# Patient Record
Sex: Female | Born: 2000 | Race: White | Hispanic: No | Smoking: Former smoker
Health system: Southern US, Community
[De-identification: ages and names within clinical notes are randomized; demographics above are authoritative.]

## PROBLEM LIST (undated history)

## (undated) DIAGNOSIS — N189 Chronic kidney disease, unspecified: Secondary | ICD-10-CM

## (undated) DIAGNOSIS — K759 Inflammatory liver disease, unspecified: Secondary | ICD-10-CM

## (undated) DIAGNOSIS — J40 Bronchitis, not specified as acute or chronic: Secondary | ICD-10-CM

## (undated) DIAGNOSIS — F909 Attention-deficit hyperactivity disorder, unspecified type: Principal | ICD-10-CM

## (undated) DIAGNOSIS — J309 Allergic rhinitis, unspecified: Secondary | ICD-10-CM

## (undated) DIAGNOSIS — T7840XA Allergy, unspecified, initial encounter: Secondary | ICD-10-CM

## (undated) DIAGNOSIS — E639 Nutritional deficiency, unspecified: Secondary | ICD-10-CM

## (undated) DIAGNOSIS — R51 Headache: Secondary | ICD-10-CM

## (undated) DIAGNOSIS — K5900 Constipation, unspecified: Secondary | ICD-10-CM

## (undated) DIAGNOSIS — R519 Headache, unspecified: Secondary | ICD-10-CM

## (undated) DIAGNOSIS — M419 Scoliosis, unspecified: Secondary | ICD-10-CM

## (undated) DIAGNOSIS — R011 Cardiac murmur, unspecified: Secondary | ICD-10-CM

## (undated) DIAGNOSIS — L659 Nonscarring hair loss, unspecified: Secondary | ICD-10-CM

## (undated) DIAGNOSIS — F419 Anxiety disorder, unspecified: Secondary | ICD-10-CM

## (undated) DIAGNOSIS — J4520 Mild intermittent asthma, uncomplicated: Secondary | ICD-10-CM

## (undated) DIAGNOSIS — R5383 Other fatigue: Secondary | ICD-10-CM

## (undated) DIAGNOSIS — S83249A Other tear of medial meniscus, current injury, unspecified knee, initial encounter: Secondary | ICD-10-CM

## (undated) DIAGNOSIS — J189 Pneumonia, unspecified organism: Secondary | ICD-10-CM

## (undated) HISTORY — DX: Headache, unspecified: R51.9

## (undated) HISTORY — DX: Inflammatory liver disease, unspecified: K75.9

## (undated) HISTORY — DX: Other tear of medial meniscus, current injury, unspecified knee, initial encounter: S83.249A

## (undated) HISTORY — DX: Cardiac murmur, unspecified: R01.1

## (undated) HISTORY — DX: Other fatigue: R53.83

## (undated) HISTORY — DX: Nutritional deficiency, unspecified: E63.9

## (undated) HISTORY — DX: Constipation, unspecified: K59.00

## (undated) HISTORY — DX: Allergic rhinitis, unspecified: J30.9

## (undated) HISTORY — DX: Anxiety disorder, unspecified: F41.9

## (undated) HISTORY — PX: NO PAST SURGERIES: SHX2092

## (undated) HISTORY — DX: Allergy, unspecified, initial encounter: T78.40XA

## (undated) HISTORY — DX: Headache: R51

## (undated) HISTORY — DX: Attention-deficit hyperactivity disorder, unspecified type: F90.9

## (undated) HISTORY — DX: Bronchitis, not specified as acute or chronic: J40

## (undated) HISTORY — DX: Mild intermittent asthma, uncomplicated: J45.20

## (undated) HISTORY — DX: Nonscarring hair loss, unspecified: L65.9

## (undated) HISTORY — DX: Chronic kidney disease, unspecified: N18.9

---
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
## 2022-01-08 ENCOUNTER — Telehealth: Payer: Self-pay | Admitting: Genetics

## 2022-01-10 ENCOUNTER — Inpatient Hospital Stay (HOSPITAL_COMMUNITY)
Admission: EM | Admit: 2022-01-10 | Discharge: 2022-01-10 | Disposition: A | Discharge status: Home / Self Care | Payer: Medicaid Other | Attending: Obstetrics and Gynecology | Admitting: Obstetrics and Gynecology

## 2022-01-10 ENCOUNTER — Other Ambulatory Visit: Payer: Self-pay

## 2022-01-10 ENCOUNTER — Emergency Department (HOSPITAL_COMMUNITY): Payer: Medicaid Other

## 2022-01-10 ENCOUNTER — Ambulatory Visit (INDEPENDENT_AMBULATORY_CARE_PROVIDER_SITE_OTHER): Payer: Medicaid Other | Admitting: Family Medicine

## 2022-01-10 VITALS — BP 101/67 | HR 131 | Wt 104.0 lb

## 2022-01-10 DIAGNOSIS — Z3A18 18 weeks gestation of pregnancy: Secondary | ICD-10-CM | POA: Diagnosis not present

## 2022-01-10 DIAGNOSIS — M25561 Pain in right knee: Secondary | ICD-10-CM | POA: Insufficient documentation

## 2022-01-10 DIAGNOSIS — I959 Hypotension, unspecified: Secondary | ICD-10-CM | POA: Diagnosis not present

## 2022-01-10 DIAGNOSIS — W2212XA Striking against or struck by front passenger side automobile airbag, initial encounter: Secondary | ICD-10-CM | POA: Diagnosis not present

## 2022-01-10 DIAGNOSIS — Z3A17 17 weeks gestation of pregnancy: Secondary | ICD-10-CM

## 2022-01-10 DIAGNOSIS — O9A212 Injury, poisoning and certain other consequences of external causes complicating pregnancy, second trimester: Secondary | ICD-10-CM | POA: Insufficient documentation

## 2022-01-10 DIAGNOSIS — Z3480 Encounter for supervision of other normal pregnancy, unspecified trimester: Secondary | ICD-10-CM

## 2022-01-10 DIAGNOSIS — Y93I9 Activity, other involving external motion: Secondary | ICD-10-CM | POA: Diagnosis not present

## 2022-01-10 DIAGNOSIS — R000 Tachycardia, unspecified: Secondary | ICD-10-CM | POA: Diagnosis not present

## 2022-01-10 DIAGNOSIS — Z68.41 Body mass index (BMI) pediatric, less than 5th percentile for age: Secondary | ICD-10-CM

## 2022-01-10 DIAGNOSIS — Z041 Encounter for examination and observation following transport accident: Secondary | ICD-10-CM | POA: Insufficient documentation

## 2022-01-10 DIAGNOSIS — S80919A Unspecified superficial injury of unspecified knee, initial encounter: Secondary | ICD-10-CM | POA: Diagnosis not present

## 2022-01-10 DIAGNOSIS — Q970 Karyotype 47, XXX: Secondary | ICD-10-CM

## 2022-01-10 DIAGNOSIS — M419 Scoliosis, unspecified: Secondary | ICD-10-CM

## 2022-01-10 DIAGNOSIS — S8011XA Contusion of right lower leg, initial encounter: Secondary | ICD-10-CM | POA: Diagnosis not present

## 2022-01-10 DIAGNOSIS — M7989 Other specified soft tissue disorders: Secondary | ICD-10-CM | POA: Diagnosis not present

## 2022-01-10 DIAGNOSIS — Z3492 Encounter for supervision of normal pregnancy, unspecified, second trimester: Secondary | ICD-10-CM | POA: Diagnosis not present

## 2022-01-10 DIAGNOSIS — R609 Edema, unspecified: Secondary | ICD-10-CM | POA: Diagnosis not present

## 2022-01-10 NOTE — Progress Notes (Signed)
WRitten and verbal d/c instructions given and understanding voiced.  
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
## 2022-01-10 NOTE — Progress Notes (Signed)
   PRENATAL VISIT NOTE  Subjective:  Melissa Farley is a 21 y.o. G2P0010 at [redacted]w[redacted]d being seen today for ongoing prenatal care.  She is currently monitored for the following issues for this high-risk pregnancy and has ADHD (attention deficit hyperactivity disorder); Allergic rhinitis; Fatigue; Generalized headaches; Inattention; Child in care of non-parental family member; PTSD (post-traumatic stress disorder); Pain in thoracic spine; Scoliosis of thoracic spine; Stiffness of joint, pelvic region and thigh; Muscle weakness (generalized); Murmur; Vasovagal syncope; Irregular menses; Hair loss; BMI (body mass index), pediatric, less than 5th percentile for age; Dermatitis due to cat hair; Generalized abdominal pain; Gastroesophageal reflux disease without esophagitis; Supervision of other normal pregnancy, antepartum; Abnormal genetic test during pregnancy; and Trisomy X syndrome on their problem list.  Patient reports no complaints.  Contractions: Not present. Vag. Bleeding: None.  Movement: Absent. Denies leaking of fluid.   The following portions of the patient's history were reviewed and updated as appropriate: allergies, current medications, past family history, past medical history, past social history, past surgical history and problem list.   Objective:   Vitals:   01/10/22 1339  BP: 101/67  Pulse: (!) 131  Weight: 104 lb (47.2 kg)    Fetal Status: Fetal Heart Rate (bpm): 153   Movement: Absent     General:  Alert, oriented and cooperative. Patient is in no acute distress.  Skin: Skin is warm and dry. No rash noted.   Cardiovascular: Normal heart rate noted  Respiratory: Normal respiratory effort, no problems with respiration noted  Abdomen: Soft, gravid, appropriate for gestational age.  Pain/Pressure: Absent     Pelvic: Cervical exam deferred        Extremities: Normal range of motion.  Edema: None  Mental Status: Normal mood and affect. Normal behavior. Normal judgment and  thought content.   Assessment and Plan:  Pregnancy: G2P0010 at [redacted]w[redacted]d 1. [redacted] weeks gestation of pregnancy - AFP, Serum, Open Spina Bifida  2. Supervision of other normal pregnancy, antepartum FHT and FH normal Has [place] next week.  3. Trisomy X syndrome Maternal Trisomy X Patient declines amnio Has referral to [facility]  4. Scoliosis of thoracic spine, unspecified scoliosis type   Preterm labor symptoms and general obstetric precautions including but not limited to vaginal bleeding, contractions, leaking of fluid and fetal movement were reviewed in detail with the patient. Please refer to After Visit Summary for other counseling recommendations.   No follow-ups on file.  Future Appointments  Date Time Provider Department Center  01/18/2022 12:45 PM [facility] NURSE [facility]  01/18/2022  1:00 PM [facility] US1 [facility]  02/07/2022  1:10 PM Reva Bores, MD [facility] None    Levie Heritage, DO
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
## 2022-01-10 NOTE — ED Notes (Signed)
Report given to MAU nurse 

## 2022-01-10 NOTE — Progress Notes (Signed)
Orthopedic Tech Progress Note Patient Details:  Melissa Farley Feb 16, 2001 [id number]  Ortho Devices Type of Ortho Device: Knee Immobilizer Ortho Device/Splint Location: rle Ortho Device/Splint Interventions: Ordered, Application, Adjustment   Post Interventions Patient Tolerated: Well Instructions Provided: Care of device, Adjustment of device  [name] Post 01/10/2022, 7:30 PM

## 2022-01-10 NOTE — ED Notes (Addendum)
This RN called ortho regarding the application of the knee immobilizer. Per ortho, they will be a couple of minutes as they are in another room at the moment.

## 2022-01-10 NOTE — MAU Note (Signed)
Marie Williams CNM in Family Rm to see pt and discuss plan of care 
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
## 2022-01-12 LAB — AFP, SERUM, OPEN SPINA BIFIDA
AFP MoM: 1.42
AFP Value: 77.8 ng/mL
Gest. Age on Collection Date: 17.6 weeks
Maternal Age At EDD: 19.3 yr
OSBR Risk 1 IN: 3404
Test Results:: NEGATIVE
Weight: 104 [lb_av]

## 2022-01-17 DIAGNOSIS — S8001XA Contusion of right knee, initial encounter: Secondary | ICD-10-CM | POA: Diagnosis not present

## 2022-01-18 ENCOUNTER — Ambulatory Visit: Payer: Medicaid Other | Admitting: *Deleted

## 2022-01-18 ENCOUNTER — Ambulatory Visit: Payer: Medicaid Other | Attending: Family Medicine

## 2022-01-18 ENCOUNTER — Other Ambulatory Visit: Payer: Self-pay | Admitting: *Deleted

## 2022-01-18 VITALS — BP 109/57 | HR 105

## 2022-01-18 DIAGNOSIS — O359XX0 Maternal care for (suspected) fetal abnormality and damage, unspecified, not applicable or unspecified: Secondary | ICD-10-CM | POA: Diagnosis not present

## 2022-01-18 DIAGNOSIS — O289 Unspecified abnormal findings on antenatal screening of mother: Secondary | ICD-10-CM | POA: Diagnosis not present

## 2022-01-18 DIAGNOSIS — Z148 Genetic carrier of other disease: Secondary | ICD-10-CM | POA: Insufficient documentation

## 2022-01-18 DIAGNOSIS — Z3480 Encounter for supervision of other normal pregnancy, unspecified trimester: Secondary | ICD-10-CM

## 2022-01-18 DIAGNOSIS — R52 Pain, unspecified: Secondary | ICD-10-CM | POA: Diagnosis not present

## 2022-01-18 DIAGNOSIS — Z68.41 Body mass index (BMI) pediatric, less than 5th percentile for age: Secondary | ICD-10-CM

## 2022-01-18 DIAGNOSIS — Z3A19 19 weeks gestation of pregnancy: Secondary | ICD-10-CM | POA: Insufficient documentation

## 2022-01-18 DIAGNOSIS — S83251A Bucket-handle tear of lateral meniscus, current injury, right knee, initial encounter: Secondary | ICD-10-CM | POA: Diagnosis not present

## 2022-01-18 DIAGNOSIS — O285 Abnormal chromosomal and genetic finding on antenatal screening of mother: Secondary | ICD-10-CM | POA: Insufficient documentation

## 2022-01-18 DIAGNOSIS — Q970 Karyotype 47, XXX: Secondary | ICD-10-CM

## 2022-01-18 DIAGNOSIS — O09892 Supervision of other high risk pregnancies, second trimester: Secondary | ICD-10-CM

## 2022-01-18 DIAGNOSIS — O358XX0 Maternal care for other (suspected) fetal abnormality and damage, not applicable or unspecified: Secondary | ICD-10-CM | POA: Diagnosis not present

## 2022-01-21 ENCOUNTER — Encounter: Payer: Self-pay | Admitting: Family Medicine

## 2022-01-21 DIAGNOSIS — O3503X0 Maternal care for (suspected) central nervous system malformation or damage in fetus, choroid plexus cysts, not applicable or unspecified: Secondary | ICD-10-CM | POA: Insufficient documentation

## 2022-01-29 ENCOUNTER — Other Ambulatory Visit: Payer: Self-pay

## 2022-01-29 ENCOUNTER — Inpatient Hospital Stay (HOSPITAL_BASED_OUTPATIENT_CLINIC_OR_DEPARTMENT_OTHER)
Admission: AD | Admit: 2022-01-29 | Discharge: 2022-01-31 | DRG: 833 | Disposition: A | Discharge status: Home / Self Care | Payer: Medicaid Other | Attending: Obstetrics and Gynecology | Admitting: Obstetrics and Gynecology

## 2022-01-29 ENCOUNTER — Encounter (HOSPITAL_BASED_OUTPATIENT_CLINIC_OR_DEPARTMENT_OTHER): Payer: Self-pay | Admitting: Emergency Medicine

## 2022-01-29 DIAGNOSIS — R109 Unspecified abdominal pain: Principal | ICD-10-CM

## 2022-01-29 DIAGNOSIS — N10 Acute tubulo-interstitial nephritis: Secondary | ICD-10-CM | POA: Diagnosis not present

## 2022-01-29 DIAGNOSIS — N390 Urinary tract infection, site not specified: Secondary | ICD-10-CM | POA: Diagnosis present

## 2022-01-29 DIAGNOSIS — O2302 Infections of kidney in pregnancy, second trimester: Principal | ICD-10-CM | POA: Diagnosis present

## 2022-01-29 DIAGNOSIS — Z87891 Personal history of nicotine dependence: Secondary | ICD-10-CM

## 2022-01-29 DIAGNOSIS — Z3A20 20 weeks gestation of pregnancy: Secondary | ICD-10-CM | POA: Diagnosis not present

## 2022-01-29 DIAGNOSIS — R1110 Vomiting, unspecified: Secondary | ICD-10-CM | POA: Diagnosis present

## 2022-01-29 DIAGNOSIS — O26892 Other specified pregnancy related conditions, second trimester: Secondary | ICD-10-CM | POA: Diagnosis not present

## 2022-01-29 HISTORY — DX: Urinary tract infection, site not specified: N39.0

## 2022-01-29 LAB — COMPREHENSIVE METABOLIC PANEL
ALT: 10 U/L (ref 0–44)
AST: 19 U/L (ref 15–41)
Albumin: 3.4 g/dL — ABNORMAL LOW (ref 3.5–5.0)
Alkaline Phosphatase: 45 U/L (ref 38–126)
Anion gap: 7 (ref 5–15)
BUN: 8 mg/dL (ref 6–20)
CO2: 19 mmol/L — ABNORMAL LOW (ref 22–32)
Calcium: 8.8 mg/dL — ABNORMAL LOW (ref 8.9–10.3)
Chloride: 109 mmol/L (ref 98–111)
Creatinine, Ser: 0.55 mg/dL (ref 0.44–1.00)
GFR, Estimated: >60 mL/min (ref >60)
Glucose, Bld: 131 mg/dL — ABNORMAL HIGH (ref 70–99)
Potassium: 3.7 mmol/L (ref 3.5–5.1)
Sodium: 135 mmol/L (ref 135–145)
Total Bilirubin: 0.5 mg/dL (ref 0.3–1.2)
Total Protein: 7 g/dL (ref 6.5–8.1)

## 2022-01-29 LAB — URINALYSIS, ROUTINE W REFLEX MICROSCOPIC
Bilirubin Urine: NEGATIVE
Glucose, UA: NEGATIVE mg/dL
Ketones, ur: 15 mg/dL — AB
Leukocytes,Ua: NEGATIVE
Nitrite: NEGATIVE
Protein, ur: 100 mg/dL — AB
Specific Gravity, Urine: >=1.03 (ref 1.005–1.030)
pH: 5.5 (ref 5.0–8.0)

## 2022-01-29 LAB — TYPE AND SCREEN
ABO/RH(D): A POS
Antibody Screen: NEGATIVE

## 2022-01-29 LAB — CBC WITH DIFFERENTIAL/PLATELET
Abs Immature Granulocytes: 0.05 10*3/uL (ref 0.00–0.07)
Basophils Absolute: 0 10*3/uL (ref 0.0–0.1)
Basophils Relative: 0 %
Eosinophils Absolute: 0 10*3/uL (ref 0.0–0.5)
Eosinophils Relative: 0 %
HCT: 32.1 % — ABNORMAL LOW (ref 36.0–46.0)
Hemoglobin: 11.2 g/dL — ABNORMAL LOW (ref 12.0–15.0)
Immature Granulocytes: 1 %
Lymphocytes Relative: 12 %
Lymphs Abs: 1.2 10*3/uL (ref 0.7–4.0)
MCH: 32.9 pg (ref 26.0–34.0)
MCHC: 34.9 g/dL (ref 30.0–36.0)
MCV: 94.4 fL (ref 80.0–100.0)
Monocytes Absolute: 0.4 10*3/uL (ref 0.1–1.0)
Monocytes Relative: 4 %
Neutro Abs: 8.9 10*3/uL — ABNORMAL HIGH (ref 1.7–7.7)
Neutrophils Relative %: 83 %
Platelets: 149 10*3/uL — ABNORMAL LOW (ref 150–400)
RBC: 3.4 MIL/uL — ABNORMAL LOW (ref 3.87–5.11)
RDW: 13.7 % (ref 11.5–15.5)
WBC: 10.6 10*3/uL — ABNORMAL HIGH (ref 4.0–10.5)
nRBC: 0 % (ref 0.0–0.2)

## 2022-01-29 LAB — URINALYSIS, MICROSCOPIC (REFLEX): RBC / HPF: >50 RBC/hpf (ref 0–5)

## 2022-01-29 LAB — LIPASE, BLOOD: Lipase: 25 U/L (ref 11–51)

## 2022-01-29 MED ORDER — DOCUSATE SODIUM 100 MG PO CAPS
100.0000 mg | ORAL_CAPSULE | Freq: Every day | ORAL | Status: DC
  Administered 2022-01-30 – 2022-01-31 (×2): 100 mg via ORAL
  Filled 2022-01-29 (×2): qty 1

## 2022-01-29 MED ORDER — SODIUM CHLORIDE 0.9% FLUSH: 3.0000 mL | INTRAVENOUS | Status: DC | PRN

## 2022-01-29 MED ORDER — SODIUM CHLORIDE 0.9 % IV SOLN
2.0000 g | INTRAVENOUS | Status: DC
  Administered 2022-01-30 – 2022-01-31 (×2): 2 g via INTRAVENOUS
  Filled 2022-01-29 (×2): qty 20

## 2022-01-29 MED ORDER — ZOLPIDEM TARTRATE 5 MG PO TABS: 5.0000 mg | ORAL_TABLET | Freq: Every evening | ORAL | Status: DC | PRN

## 2022-01-29 MED ORDER — IBUPROFEN 600 MG PO TABS
600.0000 mg | ORAL_TABLET | Freq: Four times a day (QID) | ORAL | Status: DC | PRN
  Filled 2022-01-29: qty 1

## 2022-01-29 MED ORDER — SODIUM CHLORIDE 0.9 % IV SOLN
2.0000 g | Freq: Once | INTRAVENOUS | Status: AC
  Administered 2022-01-29: 2 g via INTRAVENOUS
  Filled 2022-01-29: qty 20

## 2022-01-29 MED ORDER — SODIUM CHLORIDE 0.9 % IV SOLN: INTRAVENOUS | Status: DC | PRN

## 2022-01-29 MED ORDER — LACTATED RINGERS IV BOLUS
1000.0000 mL | Freq: Once | INTRAVENOUS | Status: AC
  Administered 2022-01-29: 1000 mL via INTRAVENOUS

## 2022-01-29 MED ORDER — SODIUM CHLORIDE 0.9% FLUSH
3.0000 mL | Freq: Two times a day (BID) | INTRAVENOUS | Status: DC
  Administered 2022-01-29 – 2022-01-31 (×3): 3 mL via INTRAVENOUS

## 2022-01-29 MED ORDER — SODIUM CHLORIDE 0.9 % IV BOLUS
500.0000 mL | Freq: Once | INTRAVENOUS | Status: AC
  Administered 2022-01-29: 500 mL via INTRAVENOUS

## 2022-01-29 MED ORDER — PROMETHAZINE HCL 25 MG/ML IJ SOLN
INTRAMUSCULAR | Status: AC
  Filled 2022-01-29: qty 1

## 2022-01-29 MED ORDER — FENTANYL CITRATE PF 50 MCG/ML IJ SOSY
50.0000 ug | PREFILLED_SYRINGE | Freq: Once | INTRAMUSCULAR | Status: AC
  Administered 2022-01-29: 50 ug via INTRAVENOUS
  Filled 2022-01-29: qty 1

## 2022-01-29 MED ORDER — ACETAMINOPHEN 160 MG/5ML PO SOLN
650.0000 mg | ORAL | Status: DC | PRN
  Administered 2022-01-29: 650 mg via ORAL
  Filled 2022-01-29: qty 20.3

## 2022-01-29 MED ORDER — HYDROMORPHONE HCL 1 MG/ML IJ SOLN
0.5000 mg | Freq: Once | INTRAMUSCULAR | Status: AC
  Administered 2022-01-29: 0.5 mg via INTRAVENOUS
  Filled 2022-01-29: qty 1

## 2022-01-29 MED ORDER — HYDROMORPHONE HCL 1 MG/ML IJ SOLN
1.0000 mg | Freq: Once | INTRAMUSCULAR | Status: AC
  Administered 2022-01-30: 1 mg via INTRAVENOUS
  Filled 2022-01-29: qty 1

## 2022-01-29 MED ORDER — ACETAMINOPHEN 325 MG PO TABS
650.0000 mg | ORAL_TABLET | ORAL | Status: DC | PRN
  Filled 2022-01-29: qty 2

## 2022-01-29 MED ORDER — CALCIUM CARBONATE ANTACID 500 MG PO CHEW: 2.0000 | CHEWABLE_TABLET | ORAL | Status: DC | PRN

## 2022-01-29 MED ORDER — ONDANSETRON HCL 4 MG/2ML IJ SOLN
4.0000 mg | Freq: Once | INTRAMUSCULAR | Status: AC
  Administered 2022-01-29: 4 mg via INTRAVENOUS
  Filled 2022-01-29: qty 2

## 2022-01-29 MED ORDER — PRENATAL MULTIVITAMIN CH
1.0000 | ORAL_TABLET | Freq: Every day | ORAL | Status: DC
  Administered 2022-01-30: 1 via ORAL
  Filled 2022-01-29 (×2): qty 1

## 2022-01-29 MED ORDER — CEFTRIAXONE SODIUM 1 G IJ SOLR
1.0000 g | Freq: Once | INTRAMUSCULAR | Status: DC
Start: 2022-01-29 — End: 2022-01-29

## 2022-01-29 MED ORDER — IBUPROFEN 100 MG/5ML PO SUSP
600.0000 mg | Freq: Four times a day (QID) | ORAL | Status: DC | PRN
  Administered 2022-01-29 – 2022-01-30 (×2): 600 mg via ORAL
  Filled 2022-01-29 (×2): qty 30

## 2022-01-29 MED ORDER — SODIUM CHLORIDE 0.9 % IV SOLN
250.0000 mL | INTRAVENOUS | Status: DC | PRN
  Administered 2022-01-31: 250 mL via INTRAVENOUS

## 2022-01-29 MED ORDER — SODIUM CHLORIDE 0.9 % IV SOLN
25.0000 mg | Freq: Four times a day (QID) | INTRAVENOUS | Status: DC | PRN
  Administered 2022-01-29: 25 mg via INTRAVENOUS
  Filled 2022-01-29: qty 1

## 2022-01-29 NOTE — ED Notes (Signed)
Apple juice and peanut butter crackers

## 2022-01-29 NOTE — ED Triage Notes (Addendum)
Pt states she is [redacted]wks pregnant. Sudden onset of severe pain in R flank/abd pain that started around 2330 last night. Endorses n/v and "feeling cold". Pt states no fetal movement that she has noticed since going to bed last night, FHT obtained in triage 144bpm. Denies bleeding from vagina or leaking of fluid. States pain is constant and only in flank and R side of abd. Urine sample obtained appears bloody.

## 2022-01-29 NOTE — H&P (Signed)
Melissa Farley is a 21 y.o. female presenting for pyelonephritis. OB History     Gravida  2   Para  0   Term  0   Preterm  0   AB  1   Living  0      SAB  1   IAB  0   Ectopic  0   Multiple  0   Live Births  0          Past Medical History:  Diagnosis Date   Acute medial meniscus tear    ADHD (attention deficit hyperactivity disorder) 11/09/2012   Allergic rhinitis 11/09/2012   Allergy    Alopecia    Anxiety    Mom with severe anxiety, became addicted to pain meds   Asthma, mild intermittent 03/10/2016   Cardiac murmur 03/25/2013   Constipation    Fatigue    Frequent headaches    Poor eating habits    Scoliosis of thoracic spine 04/19/2014   UTI (urinary tract infection) 01/29/2022   History reviewed. No pertinent surgical history. Family History: family history includes Behavior problems in her sister; Drug abuse in her father and mother; HIV in her paternal uncle; Hypertension in her maternal grandfather. Social History:  reports that she has quit smoking. Her smoking use included e-cigarettes. She has never used smokeless tobacco. She reports that she does not drink alcohol and does not use drugs.      Review of Systems  Constitutional:  Positive for chills.  Genitourinary:  Positive for flank pain.   History   Blood pressure (!) 94/53, pulse 70, temperature 98.2 F (36.8 C), temperature source Oral, resp. rate 16, height 5\' 9"  (1.753 m), weight 49.9 kg, last menstrual period 09/07/2021, SpO2 100 %. Exam Physical Exam Constitutional:      Appearance: Normal appearance.  Cardiovascular:     Rate and Rhythm: Normal rate and regular rhythm.  Pulmonary:     Effort: Pulmonary effort is normal.     Breath sounds: Normal breath sounds.  Abdominal:     General: Bowel sounds are normal.     Palpations: Abdomen is soft.     Comments: FH 19 cm  Genitourinary:    Comments: Deferred Musculoskeletal:     Comments: Right CVA tenderness   Neurological:     Mental Status: She is alert.     Prenatal labs: ABO, Rh: A/Positive/-- (05/04 1105) Antibody: Negative (05/04 1105) Rubella: 7.51 (05/04 1105) RPR: Non Reactive (05/04 1105)  HBsAg: Negative (05/04 1105)  HIV: Non Reactive (05/04 1105)  GBS:     Assessment/Plan: IUP 20 4/7 weeks Pyelonephritis  Admitted for IV antibiotics. UC pending. POC reviewed with pt. Suspect 48-72 hrs LOS   6/7 01/29/2022, 3:11 PM

## 2022-01-29 NOTE — ED Provider Notes (Signed)
[facility] EMERGENCY DEPARTMENT Provider Note   CSN: [id number] Arrival date & time: 01/29/22  0327     History {Add pertinent medical, surgical, social history, OB history to HPI:1} Chief Complaint  Patient presents with  . Flank Pain    Melissa Farley is a 21 y.o. female.  21 yo F G2P0 at approximately 20 weeks here with right sided flank pain since 11p. A couple episodes of emesis. No fevers. Uncomplicated pregnancy so far.    Flank Pain      Home Medications Prior to Admission medications   Medication Sig Start Date End Date Taking? Authorizing Provider  albuterol (PROAIR HFA) 108 (90 Base) MCG/ACT inhaler INHALE 2 PUFFS EVERY 4 HOURS AS NEEDED FOR WHEEZING OR SHORTNESS OF BREATH. Take to School Patient not taking: Reported on 11/15/2021 11/16/20   Rosiland Oz, MD  Prenatal MV & Min w/FA-DHA (PRENATAL GUMMIES PO) Take by mouth.    [provider]      Allergies    Penicillins    Review of Systems   Review of Systems  Genitourinary:  Positive for flank pain.    Physical Exam Updated Vital Signs BP (!) 147/73   Pulse 86   Temp 98.2 F (36.8 C) (Oral)   Resp 20   Ht 5\' 9"  (1.753 m)   Wt 49.9 kg   LMP 09/07/2021   SpO2 98%   BMI 16.24 kg/m  Physical Exam  ED Results / Procedures / Treatments   Labs (all labs ordered are listed, but only abnormal results are displayed) Labs Reviewed  CBC WITH DIFFERENTIAL/PLATELET - Abnormal; Notable for the following components:      Result Value   WBC 10.6 (*)    RBC 3.40 (*)    Hemoglobin 11.2 (*)    HCT 32.1 (*)    Platelets 149 (*)    Neutro Abs 8.9 (*)    All other components within normal limits  COMPREHENSIVE METABOLIC PANEL - Abnormal; Notable for the following components:   CO2 19 (*)    Glucose, Bld 131 (*)    Calcium 8.8 (*)    Albumin 3.4 (*)    All other components within normal limits  URINALYSIS, ROUTINE W REFLEX MICROSCOPIC - Abnormal; Notable for the following  components:   APPearance CLOUDY (*)    Hgb urine dipstick LARGE (*)    Ketones, ur 15 (*)    Protein, ur 100 (*)    All other components within normal limits  URINALYSIS, MICROSCOPIC (REFLEX) - Abnormal; Notable for the following components:   Bacteria, UA MANY (*)    All other components within normal limits  URINE CULTURE  LIPASE, BLOOD    EKG None  Radiology No results found.  Procedures Procedures  {Document cardiac monitor, telemetry assessment procedure when appropriate:1}  Medications Ordered in ED Medications  cefTRIAXone (ROCEPHIN) 2 g in sodium chloride 0.9 % 100 mL IVPB (has no administration in time range)  lactated ringers bolus 1,000 mL (1,000 mLs Intravenous New Bag/Given 01/29/22 0429)  ondansetron (ZOFRAN) injection 4 mg (4 mg Intravenous Given 01/29/22 0418)  fentaNYL (SUBLIMAZE) injection 50 mcg (50 mcg Intravenous Given 01/29/22 0417)    ED Course/ Medical Decision Making/ A&P                           Medical Decision Making Amount and/or Complexity of Data Reviewed Labs: ordered.  Risk Prescription drug management. Decision regarding hospitalization.   ***  {  Document critical care time when appropriate:1} {Document review of labs and clinical decision tools ie heart score, Chads2Vasc2 etc:1}  {Document your independent review of radiology images, and any outside records:1} {Document your discussion with family members, caretakers, and with consultants:1} {Document social determinants of health affecting pt's care:1} {Document your decision making why or why not admission, treatments were needed:1} Final Clinical Impression(s) / ED Diagnoses Final diagnoses:  None    Rx / DC Orders ED Discharge Orders     None

## 2022-01-29 NOTE — ED Notes (Signed)
Pt vomiting; EDP made aware 

## 2022-01-29 NOTE — ED Notes (Signed)
Report given to [facility] 

## 2022-01-30 DIAGNOSIS — Z3A20 20 weeks gestation of pregnancy: Secondary | ICD-10-CM | POA: Diagnosis not present

## 2022-01-30 DIAGNOSIS — O2302 Infections of kidney in pregnancy, second trimester: Secondary | ICD-10-CM | POA: Diagnosis not present

## 2022-01-30 LAB — URINE CULTURE: Culture: NO GROWTH

## 2022-01-30 MED ORDER — HYDROMORPHONE HCL 1 MG/ML IJ SOLN
1.0000 mg | INTRAMUSCULAR | Status: DC | PRN
  Administered 2022-01-30: 1 mg via INTRAVENOUS
  Filled 2022-01-30: qty 1

## 2022-01-30 MED ORDER — OXYCODONE-ACETAMINOPHEN 5-325 MG PO TABS
1.0000 | ORAL_TABLET | ORAL | Status: DC | PRN
  Administered 2022-01-30: 1 via ORAL
  Filled 2022-01-30 (×2): qty 1

## 2022-01-30 NOTE — Progress Notes (Signed)
Patient ID: Melissa Farley, female   DOB: Aug 02, 2000, 21 y.o.   MRN: [id number] ACULTY PRACTICE ANTEPARTUM COMPREHENSIVE PROGRESS NOTE  Melissa Farley is a 21 y.o. G2P0010 at [redacted]w[redacted]d  who is admitted for pyelonephritis.   Fetal presentation is unsure. Length of Stay:  1  Days  Subjective: Pt still reports right flank pain.  Patient reports good fetal movement.  She reports no uterine contractions, no bleeding and no loss of fluid per vagina.  Vitals:  Blood pressure 106/70, pulse 69, temperature 98.4 F (36.9 C), temperature source Oral, resp. rate 18, height 5\' 9"  (1.753 m), weight 49.9 kg, last menstrual period 09/07/2021, SpO2 100 %.  Physical Examination: Lungs clear Heart RRR Abd soft + BS R CVA tenderness Ext non tender  Fetal Monitoring:   + FHT's  Labs:  Results for orders placed or performed during the hospital encounter of 01/29/22 (from the past 24 hour(s))  Type and screen [facility]   Collection Time: 01/29/22  3:47 PM  Result Value Ref Range   ABO/RH(D) A POS    Antibody Screen NEG    Sample Expiration      02/01/2022,2359 Performed at [facility], [address] [place], [place]     Imaging Studies:    NA   Medications:  Scheduled  docusate sodium  100 mg Oral Daily   prenatal multivitamin  1 tablet Oral Q1200   sodium chloride flush  3 mL Intravenous Q12H   I have reviewed the patient's current medications.  ASSESSMENT: IUP 20 5/7 weeks Pyelonephritis  PLAN: Stable. Continue with IV antibiotics. Await UC results Continue routine antenatal care.   7/7 01/30/2022,8:54 AM

## 2022-01-31 DIAGNOSIS — R109 Unspecified abdominal pain: Secondary | ICD-10-CM | POA: Diagnosis not present

## 2022-01-31 DIAGNOSIS — N10 Acute tubulo-interstitial nephritis: Secondary | ICD-10-CM | POA: Diagnosis not present

## 2022-01-31 DIAGNOSIS — O2302 Infections of kidney in pregnancy, second trimester: Secondary | ICD-10-CM | POA: Diagnosis not present

## 2022-01-31 DIAGNOSIS — Z3A20 20 weeks gestation of pregnancy: Secondary | ICD-10-CM | POA: Diagnosis not present

## 2022-01-31 MED ORDER — CEPHALEXIN 250 MG/5ML PO SUSR: 500.0000 mg | Freq: Three times a day (TID) | ORAL | 0 refills | Status: DC

## 2022-01-31 MED ORDER — OXYCODONE-ACETAMINOPHEN 5-325 MG PO TABS: 1.0000 | ORAL_TABLET | ORAL | 0 refills | Status: DC | PRN

## 2022-01-31 MED ORDER — CEPHALEXIN 250 MG/5ML PO SUSR
500.0000 mg | Freq: Three times a day (TID) | ORAL | Status: DC
Start: 2022-01-31 — End: 2022-01-31
  Filled 2022-01-31 (×2): qty 10

## 2022-01-31 MED ORDER — IBUPROFEN 100 MG/5ML PO SUSP: 600.0000 mg | Freq: Four times a day (QID) | ORAL | 0 refills | Status: DC | PRN

## 2022-01-31 NOTE — Progress Notes (Signed)
Patient ID: Melissa Farley, female   DOB: [birth year], 21 y.o.   MRN: [id number] FACULTY PRACTICE ANTEPARTUM(COMPREHENSIVE) NOTE  Melissa Farley is a 21 y.o. G2P0010 at [redacted]w[redacted]d by LMP who is admitted for pyelonephritis.   Fetal presentation is unsure. Length of Stay:  2  Days  Subjective: Feels better but had pain medication for flank pain during the night Patient reports the fetal movement as active. Patient reports uterine contraction  activity as none. Patient reports  vaginal bleeding as none. Patient describes fluid per vagina as None.  Vitals:  Blood pressure (!) 98/42, pulse 72, temperature 98.3 F (36.8 C), temperature source Oral, resp. rate 18, height 5\' 9"  (1.753 m), weight 49.9 kg, last menstrual period 09/07/2021, SpO2 99 %. Physical Examination:  General appearance - alert, well appearing, and in no distress Heart - normal rate and regular rhythm Abdomen - soft, nontender, nondistended, negative CVAT Fundal Height:  size equals dates Cervical Exam: Not evaluated. Extremities: extremities normal, atraumatic, no cyanosis or edema and Homans sign is negative, no sign of DVT  Membranes:intact  Fetal Monitoring:  142 bpm filed at 01/30/2022 1708  Labs:  No results found for this or any previous visit (from the past 24 hour(s)).   Medications:  Scheduled  docusate sodium  100 mg Oral Daily   prenatal multivitamin  1 tablet Oral Q1200   sodium chloride flush  3 mL Intravenous Q12H   I have reviewed the patient's current medications.  ASSESSMENT: Patient Active Problem List   Diagnosis Date Noted   UTI (urinary tract infection) 01/29/2022   Emesis 01/29/2022   Pyelonephritis affecting pregnancy in second trimester 01/29/2022   Choroid plexus cyst of fetus affecting care of mother, antepartum 01/21/2022   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   Abnormal genetic test during pregnancy 12/06/2021   Supervision of other normal pregnancy, antepartum 11/15/2021   Generalized  abdominal pain 06/24/2019   Gastroesophageal reflux disease without esophagitis 06/24/2019   Dermatitis due to cat hair 05/21/2019   BMI (body mass index), pediatric, less than 5th percentile for age 39/29/2019   Hair loss 11/19/2017   Irregular menses 08/04/2017   Vasovagal syncope 03/07/2015   Pain in thoracic spine 04/19/2014   Scoliosis of thoracic spine 04/19/2014   Stiffness of joint, pelvic region and thigh 04/19/2014   Muscle weakness (generalized) 04/19/2014   PTSD (post-traumatic stress disorder) 08/03/2013   Fatigue 05/26/2013   Generalized headaches 05/26/2013   Inattention 05/26/2013   Child in care of non-parental family member 05/26/2013   Murmur 03/25/2013   ADHD (attention deficit hyperactivity disorder) 11/09/2012   Allergic rhinitis 11/09/2012   Negative urine culture PLAN: Observe for recurrent pain or fever, complete 48 hr abx before discharge  11/11/2012 01/31/2022,7:39 AM

## 2022-01-31 NOTE — Discharge Summary (Signed)
Patient ID: Melissa Farley MRN: [id number] DOB/AGE: January 29, 2001 21 y.o.  Admit date: 01/29/2022 Discharge date: 01/31/2022  Admission Diagnoses: IUP 20 weeks, pyelonephritis  Discharge Diagnoses: SAA with negative urine culture  Prenatal Procedures: none  Consults: none  Hospital Course:  This is a 21 y.o. G2P0010 with IUP at [redacted]w[redacted]d admitted for pyelonephritis. Was placed on antibiotics x 48 hrs. Rt flank pain improved, remained afebrile and normal WBC. Urine culture was negative. Fetal well being remained reassuring during her hospitalization. Felt amendable for discharge home.   Discharge Exam: Temp:  [97.8 F (36.6 C)-98.6 F (37 C)] 98 F (36.7 C) (07/20 1105) Pulse Rate:  [72-95] 72 (07/20 0737) Resp:  [16-18] 18 (07/20 1105) BP: (80-103)/(42-59) 95/53 (07/20 1105) SpO2:  [98 %-100 %] 100 % (07/20 1105) Physical Examination: Lungs clear Heart RRR Min right CVA tenderness Ext non tender   Fetal monitoring: + FHT's  Significant Diagnostic Studies:  Results for orders placed or performed during the hospital encounter of 01/29/22 (from the past 168 hour(s))  Urine Culture   Collection Time: 01/29/22  4:11 AM   Specimen: Urine, Clean Catch  Result Value Ref Range   Specimen Description      URINE, CLEAN CATCH Performed at [facility], [address] [place], [place] [address]    Special Requests      NONE Performed at [facility], [address] [place], [place] [address]    Culture      NO GROWTH Performed at [facility], [address], [place], [place] [address]    Report Status 01/30/2022 FINAL   CBC with Differential   Collection Time: 01/29/22  4:11 AM  Result Value Ref Range   WBC 10.6 (H) 4.0 - 10.5 K/uL   RBC 3.40 (L) 3.87 - 5.11 MIL/uL   Hemoglobin 11.2 (L) 12.0 - 15.0 g/dL   HCT 44.3 (L) 15.4 - 00.8 %   MCV 94.4 80.0 - 100.0 fL   MCH 32.9 26.0 - 34.0 pg   MCHC 34.9 30.0 - 36.0 g/dL   RDW 67.6 19.5 - 09.3 %    Platelets 149 (L) 150 - 400 K/uL   nRBC 0.0 0.0 - 0.2 %   Neutrophils Relative % 83 %   Neutro Abs 8.9 (H) 1.7 - 7.7 K/uL   Lymphocytes Relative 12 %   Lymphs Abs 1.2 0.7 - 4.0 K/uL   Monocytes Relative 4 %   Monocytes Absolute 0.4 0.1 - 1.0 K/uL   Eosinophils Relative 0 %   Eosinophils Absolute 0.0 0.0 - 0.5 K/uL   Basophils Relative 0 %   Basophils Absolute 0.0 0.0 - 0.1 K/uL   Immature Granulocytes 1 %   Abs Immature Granulocytes 0.05 0.00 - 0.07 K/uL  Comprehensive metabolic panel   Collection Time: 01/29/22  4:11 AM  Result Value Ref Range   Sodium 135 135 - 145 mmol/L   Potassium 3.7 3.5 - 5.1 mmol/L   Chloride 109 98 - 111 mmol/L   CO2 19 (L) 22 - 32 mmol/L   Glucose, Bld 131 (H) 70 - 99 mg/dL   BUN 8 6 - 20 mg/dL   Creatinine, Ser 2.67 0.44 - 1.00 mg/dL   Calcium 8.8 (L) 8.9 - 10.3 mg/dL   Total Protein 7.0 6.5 - 8.1 g/dL   Albumin 3.4 (L) 3.5 - 5.0 g/dL   AST 19 15 - 41 U/L   ALT 10 0 - 44 U/L  Alkaline Phosphatase 45 38 - 126 U/L   Total Bilirubin 0.5 0.3 - 1.2 mg/dL   GFR, Estimated >03 >49 mL/min   Anion gap 7 5 - 15  Urinalysis, Routine w reflex microscopic Urine, Clean Catch   Collection Time: 01/29/22  4:11 AM  Result Value Ref Range   Color, Urine YELLOW YELLOW   APPearance CLOUDY (A) CLEAR   Specific Gravity, Urine >=1.030 1.005 - 1.030   pH 5.5 5.0 - 8.0   Glucose, UA NEGATIVE NEGATIVE mg/dL   Hgb urine dipstick LARGE (A) NEGATIVE   Bilirubin Urine NEGATIVE NEGATIVE   Ketones, ur 15 (A) NEGATIVE mg/dL   Protein, ur 179 (A) NEGATIVE mg/dL   Nitrite NEGATIVE NEGATIVE   Leukocytes,Ua NEGATIVE NEGATIVE  Lipase, blood   Collection Time: 01/29/22  4:11 AM  Result Value Ref Range   Lipase 25 11 - 51 U/L  Urinalysis, Microscopic (reflex)   Collection Time: 01/29/22  4:11 AM  Result Value Ref Range   RBC / HPF >50 0 - 5 RBC/hpf   WBC, UA 6-10 0 - 5 WBC/hpf   Bacteria, UA MANY (A) NONE SEEN   Squamous Epithelial / LPF 6-10 0 - 5  Type and screen  [facility]   Collection Time: 01/29/22  3:47 PM  Result Value Ref Range   ABO/RH(D) A POS    Antibody Screen NEG    Sample Expiration      02/01/2022,2359 Performed at [facility], [address], [place], [place] [address]     Discharge Condition: Stable  Disposition: Discharge disposition: 01-Home or Self Care        Discharge Instructions     Discharge activity:  No Restrictions   Complete by: As directed    Discharge diet:  No restrictions   Complete by: As directed    No sexual activity restrictions   Complete by: As directed    Notify physician for a general feeling that "something is not right"   Complete by: As directed    Notify physician for increase or change in vaginal discharge   Complete by: As directed    Notify physician for intestinal cramps, with or without diarrhea, sometimes described as "gas pain"   Complete by: As directed    Notify physician for leaking of fluid   Complete by: As directed    Notify physician for low, dull backache, unrelieved by heat or Tylenol   Complete by: As directed    Notify physician for menstrual like cramps   Complete by: As directed    Notify physician for pelvic pressure   Complete by: As directed    Notify physician for uterine contractions.  These may be painless and feel like the uterus is tightening or the baby is  "balling up"   Complete by: As directed    Notify physician for vaginal bleeding   Complete by: As directed    PRETERM LABOR:  Includes any of the follwing symptoms that occur between 20 - [redacted] weeks gestation.  If these symptoms are not stopped, preterm labor can result in preterm delivery, placing your baby at risk   Complete by: As directed       Allergies as of 01/31/2022       Reactions   Penicillins Rash        Medication List     STOP taking these medications    albuterol 108 (90 Base) MCG/ACT inhaler Commonly known as: ProAir HFA  TAKE these  medications    cephALEXin 250 MG/5ML suspension Commonly known as: KEFLEX Take 10 mLs (500 mg total) by mouth every 8 (eight) hours.   ibuprofen 100 MG/5ML suspension Commonly known as: ADVIL Take 30 mLs (600 mg total) by mouth every 6 (six) hours as needed for fever (headache).   oxyCODONE-acetaminophen 5-325 MG tablet Commonly known as: PERCOCET/ROXICET Take 1 tablet by mouth every 4 (four) hours as needed for moderate pain.   PRENATAL GUMMIES PO Take by mouth.         Signed: Hermina Staggers M.D. 01/31/2022, 11:45 AM

## 2022-02-07 ENCOUNTER — Ambulatory Visit (INDEPENDENT_AMBULATORY_CARE_PROVIDER_SITE_OTHER): Payer: Medicaid Other | Admitting: Family Medicine

## 2022-02-07 VITALS — BP 101/58 | HR 133 | Wt 110.0 lb

## 2022-02-07 DIAGNOSIS — Z3480 Encounter for supervision of other normal pregnancy, unspecified trimester: Secondary | ICD-10-CM

## 2022-02-07 DIAGNOSIS — Z3A21 21 weeks gestation of pregnancy: Secondary | ICD-10-CM

## 2022-02-07 NOTE — Progress Notes (Signed)
   PRENATAL VISIT NOTE  Subjective:  Melissa Farley is a 21 y.o. G2P0010 at [redacted]w[redacted]d being seen today for ongoing prenatal care.  She is currently monitored for the following issues for this high-risk pregnancy and has ADHD (attention deficit hyperactivity disorder); Allergic rhinitis; Fatigue; Generalized headaches; Inattention; Child in care of non-parental family member; PTSD (post-traumatic stress disorder); Pain in thoracic spine; Scoliosis of thoracic spine; Stiffness of joint, pelvic region and thigh; Muscle weakness (generalized); Murmur; Vasovagal syncope; Irregular menses; Hair loss; BMI (body mass index), pediatric, less than 5th percentile for age; Dermatitis due to cat hair; Generalized abdominal pain; Gastroesophageal reflux disease without esophagitis; Supervision of other normal pregnancy, antepartum; Abnormal genetic test during pregnancy; Trisomy X syndrome; Choroid plexus cyst of fetus affecting care of mother, antepartum; UTI (urinary tract infection); Emesis; and Pyelonephritis affecting pregnancy in second trimester on their problem list.  Patient reports no complaints.  Contractions: Not present. Vag. Bleeding: None.  Movement: Present. Denies leaking of fluid.   The following portions of the patient's history were reviewed and updated as appropriate: allergies, current medications, past family history, past medical history, past social history, past surgical history and problem list.   Objective:   Vitals:   02/07/22 1317  BP: (!) 101/58  Pulse: (!) 133  Weight: 110 lb (49.9 kg)    Fetal Status: Fetal Heart Rate (bpm): 150 Fundal Height: 21 cm Movement: Present     General:  Alert, oriented and cooperative. Patient is in no acute distress.  Skin: Skin is warm and dry. No rash noted.   Cardiovascular: Normal heart rate noted  Respiratory: Normal respiratory effort, no problems with respiration noted  Abdomen: Soft, gravid, appropriate for gestational age.   Pain/Pressure: Absent     Pelvic: Cervical exam deferred        Extremities: Normal range of motion.  Edema: None  Mental Status: Normal mood and affect. Normal behavior. Normal judgment and thought content.   Assessment and Plan:  Pregnancy: G2P0010 at [redacted]w[redacted]d 1. [redacted] weeks gestation of pregnancy   2. Supervision of other normal pregnancy, antepartum Continue routine prenatal care. Urine cx negative, completed Abx  Preterm labor symptoms and general obstetric precautions including but not limited to vaginal bleeding, contractions, leaking of fluid and fetal movement were reviewed in detail with the patient. Please refer to After Visit Summary for other counseling recommendations.   Return in 4 weeks (on 03/07/2022).  Future Appointments  Date Time Provider Department Center  02/15/2022  2:30 PM [facility] NURSE [facility]  02/15/2022  2:45 PM [facility] US5 [facility]  03/14/2022  8:15 AM Levie Heritage, DO [facility] None  03/29/2022 11:15 AM Levie Heritage, DO [facility] None  04/11/2022  2:10 PM Levie Heritage, DO [facility] None  04/25/2022  1:30 PM Levie Heritage, DO [facility] None  05/09/2022  1:30 PM Levie Heritage, DO [facility] None    Reva Bores, MD

## 2022-02-07 NOTE — Progress Notes (Signed)
Recent UTI treated in [facility]. Armandina Stammer RN

## 2022-02-15 ENCOUNTER — Ambulatory Visit: Payer: Medicaid Other | Attending: Obstetrics

## 2022-02-15 ENCOUNTER — Ambulatory Visit: Payer: Medicaid Other | Admitting: *Deleted

## 2022-02-15 ENCOUNTER — Other Ambulatory Visit: Payer: Self-pay | Admitting: *Deleted

## 2022-02-15 VITALS — BP 93/67 | HR 115

## 2022-02-15 DIAGNOSIS — Z3A23 23 weeks gestation of pregnancy: Secondary | ICD-10-CM | POA: Diagnosis not present

## 2022-02-15 DIAGNOSIS — O358XX0 Maternal care for other (suspected) fetal abnormality and damage, not applicable or unspecified: Secondary | ICD-10-CM | POA: Diagnosis not present

## 2022-02-15 DIAGNOSIS — O289 Unspecified abnormal findings on antenatal screening of mother: Secondary | ICD-10-CM | POA: Diagnosis not present

## 2022-02-15 DIAGNOSIS — O285 Abnormal chromosomal and genetic finding on antenatal screening of mother: Secondary | ICD-10-CM

## 2022-02-15 DIAGNOSIS — Q970 Karyotype 47, XXX: Secondary | ICD-10-CM | POA: Insufficient documentation

## 2022-02-15 DIAGNOSIS — Z3480 Encounter for supervision of other normal pregnancy, unspecified trimester: Secondary | ICD-10-CM | POA: Insufficient documentation

## 2022-02-15 DIAGNOSIS — Z148 Genetic carrier of other disease: Secondary | ICD-10-CM | POA: Diagnosis not present

## 2022-02-15 DIAGNOSIS — O09892 Supervision of other high risk pregnancies, second trimester: Secondary | ICD-10-CM | POA: Insufficient documentation

## 2022-02-15 DIAGNOSIS — Z68.41 Body mass index (BMI) pediatric, less than 5th percentile for age: Secondary | ICD-10-CM | POA: Insufficient documentation

## 2022-02-15 DIAGNOSIS — O3503X0 Maternal care for (suspected) central nervous system malformation or damage in fetus, choroid plexus cysts, not applicable or unspecified: Secondary | ICD-10-CM

## 2022-03-11 ENCOUNTER — Ambulatory Visit: Payer: Medicaid Other | Admitting: *Deleted

## 2022-03-11 ENCOUNTER — Other Ambulatory Visit: Payer: Self-pay | Admitting: *Deleted

## 2022-03-11 ENCOUNTER — Ambulatory Visit: Payer: Medicaid Other | Attending: Obstetrics and Gynecology

## 2022-03-11 ENCOUNTER — Encounter: Payer: Self-pay | Admitting: *Deleted

## 2022-03-11 VITALS — BP 94/65 | HR 120

## 2022-03-11 DIAGNOSIS — O285 Abnormal chromosomal and genetic finding on antenatal screening of mother: Secondary | ICD-10-CM | POA: Insufficient documentation

## 2022-03-11 DIAGNOSIS — O3503X0 Maternal care for (suspected) central nervous system malformation or damage in fetus, choroid plexus cysts, not applicable or unspecified: Secondary | ICD-10-CM | POA: Diagnosis not present

## 2022-03-11 DIAGNOSIS — O359XX0 Maternal care for (suspected) fetal abnormality and damage, unspecified, not applicable or unspecified: Secondary | ICD-10-CM | POA: Insufficient documentation

## 2022-03-11 DIAGNOSIS — O280 Abnormal hematological finding on antenatal screening of mother: Secondary | ICD-10-CM

## 2022-03-11 DIAGNOSIS — Z148 Genetic carrier of other disease: Secondary | ICD-10-CM | POA: Insufficient documentation

## 2022-03-11 DIAGNOSIS — Z3480 Encounter for supervision of other normal pregnancy, unspecified trimester: Secondary | ICD-10-CM

## 2022-03-11 DIAGNOSIS — Z3A26 26 weeks gestation of pregnancy: Secondary | ICD-10-CM | POA: Diagnosis not present

## 2022-03-14 ENCOUNTER — Ambulatory Visit (INDEPENDENT_AMBULATORY_CARE_PROVIDER_SITE_OTHER): Payer: Medicaid Other | Admitting: Family Medicine

## 2022-03-14 ENCOUNTER — Encounter: Payer: Self-pay | Admitting: General Practice

## 2022-03-14 VITALS — BP 113/69 | HR 94 | Wt 114.0 lb

## 2022-03-14 DIAGNOSIS — Z3A26 26 weeks gestation of pregnancy: Secondary | ICD-10-CM

## 2022-03-14 DIAGNOSIS — Z3482 Encounter for supervision of other normal pregnancy, second trimester: Secondary | ICD-10-CM

## 2022-03-14 DIAGNOSIS — Q970 Karyotype 47, XXX: Secondary | ICD-10-CM

## 2022-03-14 DIAGNOSIS — O2302 Infections of kidney in pregnancy, second trimester: Secondary | ICD-10-CM

## 2022-03-14 DIAGNOSIS — Z3480 Encounter for supervision of other normal pregnancy, unspecified trimester: Secondary | ICD-10-CM

## 2022-03-14 DIAGNOSIS — M419 Scoliosis, unspecified: Secondary | ICD-10-CM

## 2022-03-14 DIAGNOSIS — F4310 Post-traumatic stress disorder, unspecified: Secondary | ICD-10-CM

## 2022-03-14 MED ORDER — NITROFURANTOIN MONOHYD MACRO 100 MG PO CAPS: 100.0000 mg | ORAL_CAPSULE | Freq: Every day | ORAL | 1 refills | Status: DC

## 2022-03-14 NOTE — Progress Notes (Signed)
   PRENATAL VISIT NOTE  Subjective:  Melissa Farley is a 21 y.o. G2P0010 at [redacted]w[redacted]d being seen today for ongoing prenatal care.  She is currently monitored for the following issues for this high-risk pregnancy and has ADHD (attention deficit hyperactivity disorder); Allergic rhinitis; Fatigue; Generalized headaches; Inattention; Child in care of non-parental family member; PTSD (post-traumatic stress disorder); Pain in thoracic spine; Scoliosis of thoracic spine; Stiffness of joint, pelvic region and thigh; Muscle weakness (generalized); Murmur; Vasovagal syncope; Irregular menses; Hair loss; BMI (body mass index), pediatric, less than 5th percentile for age; Dermatitis due to cat hair; Generalized abdominal pain; Gastroesophageal reflux disease without esophagitis; Supervision of other normal pregnancy, antepartum; Abnormal genetic test during pregnancy; Trisomy X syndrome; Choroid plexus cyst of fetus affecting care of mother, antepartum; UTI (urinary tract infection); Emesis; and Pyelonephritis affecting pregnancy in second trimester on their problem list.  Patient reports no complaints.  Contractions: Not present. Vag. Bleeding: None.  Movement: Present. Denies leaking of fluid.   The following portions of the patient's history were reviewed and updated as appropriate: allergies, current medications, past family history, past medical history, past social history, past surgical history and problem list.   Objective:   Vitals:   03/14/22 0850  BP: 113/69  Pulse: 94  Weight: 114 lb (51.7 kg)    Fetal Status: Fetal Heart Rate (bpm): 130 Fundal Height: 25 cm Movement: Present     General:  Alert, oriented and cooperative. Patient is in no acute distress.  Skin: Skin is warm and dry. No rash noted.   Cardiovascular: Normal heart rate noted  Respiratory: Normal respiratory effort, no problems with respiration noted  Abdomen: Soft, gravid, appropriate for gestational age.  Pain/Pressure:  Absent     Pelvic: Cervical exam deferred        Extremities: Normal range of motion.  Edema: None  Mental Status: Normal mood and affect. Normal behavior. Normal judgment and thought content.   Assessment and Plan:  Pregnancy: G2P0010 at [redacted]w[redacted]d 1. Supervision of other normal pregnancy, antepartum FHT and FH normal - Glucose Tolerance, 2 Hours w/1 Hour - CBC - RPR - HIV antibody (with reflex)  2. [redacted] weeks gestation of pregnancy  3. Pyelonephritis affecting pregnancy in second trimester Needs ppx for remainder of the pregnancy. Start macrobid at night.  4. Trisomy X syndrome Fetus LR for trisomy  5. Scoliosis of thoracic spine, unspecified scoliosis type  6. PTSD (post-traumatic stress disorder)   Preterm labor symptoms and general obstetric precautions including but not limited to vaginal bleeding, contractions, leaking of fluid and fetal movement were reviewed in detail with the patient. Please refer to After Visit Summary for other counseling recommendations.   No follow-ups on file.  Future Appointments  Date Time Provider Department Center  03/29/2022 11:15 AM Levie Heritage, DO [facility] None  04/08/2022  1:30 PM [facility] NURSE [facility]  04/08/2022  1:45 PM [facility] US6 [facility]  04/11/2022  2:10 PM Levie Heritage, DO [facility] None  04/25/2022  1:30 PM Levie Heritage, DO [facility] None  05/09/2022  1:30 PM Levie Heritage, DO [facility] None  05/23/2022  1:30 PM Levie Heritage, DO [facility] None  05/30/2022  1:30 PM Levie Heritage, DO [facility] None  06/05/2022 11:15 AM Lorriane Shire, MD [facility] None  06/13/2022  1:30 PM Levie Heritage, DO [facility] None    Levie Heritage, DO

## 2022-03-14 NOTE — Progress Notes (Signed)
Peds list given. Armandina Stammer  RN

## 2022-03-15 LAB — RPR: RPR Ser Ql: NONREACTIVE

## 2022-03-15 LAB — GLUCOSE TOLERANCE, 2 HOURS W/ 1HR
Glucose, 1 hour: 72 mg/dL (ref 70–179)
Glucose, 2 hour: 95 mg/dL (ref 70–152)
Glucose, Fasting: 79 mg/dL (ref 70–91)

## 2022-03-15 LAB — CBC
Hematocrit: 31 % — ABNORMAL LOW (ref 34.0–46.6)
Hemoglobin: 10.8 g/dL — ABNORMAL LOW (ref 11.1–15.9)
MCH: 32.8 pg (ref 26.6–33.0)
MCHC: 34.8 g/dL (ref 31.5–35.7)
MCV: 94 fL (ref 79–97)
Platelets: 137 10*3/uL — ABNORMAL LOW (ref 150–450)
RBC: 3.29 x10E6/uL — ABNORMAL LOW (ref 3.77–5.28)
RDW: 11.2 % — ABNORMAL LOW (ref 11.7–15.4)
WBC: 7.1 10*3/uL (ref 3.4–10.8)

## 2022-03-15 LAB — HIV ANTIBODY (ROUTINE TESTING W REFLEX): HIV Screen 4th Generation wRfx: NONREACTIVE

## 2022-03-29 ENCOUNTER — Ambulatory Visit (INDEPENDENT_AMBULATORY_CARE_PROVIDER_SITE_OTHER): Payer: Medicaid Other | Admitting: Family Medicine

## 2022-03-29 VITALS — BP 97/65 | HR 100 | Wt 118.0 lb

## 2022-03-29 DIAGNOSIS — O2302 Infections of kidney in pregnancy, second trimester: Secondary | ICD-10-CM

## 2022-03-29 DIAGNOSIS — Z3480 Encounter for supervision of other normal pregnancy, unspecified trimester: Secondary | ICD-10-CM

## 2022-03-29 DIAGNOSIS — Q970 Karyotype 47, XXX: Secondary | ICD-10-CM

## 2022-03-29 DIAGNOSIS — Z68.41 Body mass index (BMI) pediatric, less than 5th percentile for age: Secondary | ICD-10-CM

## 2022-03-29 DIAGNOSIS — Z3A29 29 weeks gestation of pregnancy: Secondary | ICD-10-CM

## 2022-03-29 DIAGNOSIS — F4310 Post-traumatic stress disorder, unspecified: Secondary | ICD-10-CM

## 2022-03-29 NOTE — Progress Notes (Signed)
   PRENATAL VISIT NOTE  Subjective:  Melissa Farley is a 21 y.o. G2P0010 at [redacted]w[redacted]d being seen today for ongoing prenatal care.  She is currently monitored for the following issues for this high-risk pregnancy and has ADHD (attention deficit hyperactivity disorder); Allergic rhinitis; Fatigue; Generalized headaches; Inattention; Child in care of non-parental family member; PTSD (post-traumatic stress disorder); Pain in thoracic spine; Scoliosis of thoracic spine; Stiffness of joint, pelvic region and thigh; Muscle weakness (generalized); Murmur; Vasovagal syncope; Irregular menses; Hair loss; BMI (body mass index), pediatric, less than 5th percentile for age; Dermatitis due to cat hair; Generalized abdominal pain; Gastroesophageal reflux disease without esophagitis; Supervision of other normal pregnancy, antepartum; Abnormal genetic test during pregnancy; Trisomy X syndrome; UTI (urinary tract infection); and Pyelonephritis affecting pregnancy in second trimester on their problem list.  Patient reports no complaints.  Contractions: Not present. Vag. Bleeding: None.  Movement: Present. Denies leaking of fluid.   The following portions of the patient's history were reviewed and updated as appropriate: allergies, current medications, past family history, past medical history, past social history, past surgical history and problem list.   Objective:   Vitals:   03/29/22 1118  BP: 97/65  Pulse: 100  Weight: 118 lb (53.5 kg)    Fetal Status: Fetal Heart Rate (bpm): 123 Fundal Height: 28 cm Movement: Present     General:  Alert, oriented and cooperative. Patient is in no acute distress.  Skin: Skin is warm and dry. No rash noted.   Cardiovascular: Normal heart rate noted  Respiratory: Normal respiratory effort, no problems with respiration noted  Abdomen: Soft, gravid, appropriate for gestational age.  Pain/Pressure: Absent     Pelvic: Cervical exam deferred        Extremities: Normal range of  motion.  Edema: None  Mental Status: Normal mood and affect. Normal behavior. Normal judgment and thought content.   Assessment and Plan:  Pregnancy: G2P0010 at [redacted]w[redacted]d 1. [redacted] weeks gestation of pregnancy 2. Supervision of other normal pregnancy, antepartum FHT and FH normal 8/28 - EFW 16%, AC 12% Improved from previous. Has [place] for growth on 9/25.  3. Trisomy X syndrome  4. BMI (body mass index), pediatric, less than 5th percentile for age  35. PTSD (post-traumatic stress disorder)  6. Pyelonephritis affecting pregnancy in second trimester On nitrofuratoin.   Preterm labor symptoms and general obstetric precautions including but not limited to vaginal bleeding, contractions, leaking of fluid and fetal movement were reviewed in detail with the patient. Please refer to After Visit Summary for other counseling recommendations.   No follow-ups on file.  Future Appointments  Date Time Provider Department Center  04/08/2022  1:30 PM [facility] NURSE [facility]  04/08/2022  1:45 PM [facility] US6 [facility]  04/11/2022  2:10 PM Levie Heritage, DO [facility] None  04/25/2022  1:30 PM Levie Heritage, DO [facility] None  05/09/2022  1:30 PM Levie Heritage, DO [facility] None  05/23/2022  1:30 PM Levie Heritage, DO [facility] None  05/30/2022  1:30 PM Levie Heritage, DO [facility] None  06/05/2022 11:15 AM Lorriane Shire, MD [facility] None  06/13/2022  1:30 PM Levie Heritage, DO [facility] None    Levie Heritage, DO

## 2022-04-08 ENCOUNTER — Encounter: Payer: Self-pay | Admitting: *Deleted

## 2022-04-08 ENCOUNTER — Ambulatory Visit: Payer: Medicaid Other | Admitting: *Deleted

## 2022-04-08 ENCOUNTER — Other Ambulatory Visit: Payer: Self-pay | Admitting: *Deleted

## 2022-04-08 ENCOUNTER — Ambulatory Visit: Payer: Medicaid Other | Attending: Obstetrics

## 2022-04-08 VITALS — BP 101/53 | HR 115

## 2022-04-08 DIAGNOSIS — Z362 Encounter for other antenatal screening follow-up: Secondary | ICD-10-CM | POA: Insufficient documentation

## 2022-04-08 DIAGNOSIS — O3503X1 Maternal care for (suspected) central nervous system malformation or damage in fetus, choroid plexus cysts, fetus 1: Secondary | ICD-10-CM

## 2022-04-08 DIAGNOSIS — Z3689 Encounter for other specified antenatal screening: Secondary | ICD-10-CM

## 2022-04-08 DIAGNOSIS — O359XX0 Maternal care for (suspected) fetal abnormality and damage, unspecified, not applicable or unspecified: Secondary | ICD-10-CM | POA: Insufficient documentation

## 2022-04-08 DIAGNOSIS — Z3480 Encounter for supervision of other normal pregnancy, unspecified trimester: Secondary | ICD-10-CM | POA: Insufficient documentation

## 2022-04-08 DIAGNOSIS — Q999 Chromosomal abnormality, unspecified: Secondary | ICD-10-CM

## 2022-04-08 DIAGNOSIS — Z148 Genetic carrier of other disease: Secondary | ICD-10-CM | POA: Diagnosis not present

## 2022-04-08 DIAGNOSIS — O280 Abnormal hematological finding on antenatal screening of mother: Secondary | ICD-10-CM | POA: Diagnosis not present

## 2022-04-08 DIAGNOSIS — Z3A30 30 weeks gestation of pregnancy: Secondary | ICD-10-CM | POA: Insufficient documentation

## 2022-04-11 ENCOUNTER — Ambulatory Visit (INDEPENDENT_AMBULATORY_CARE_PROVIDER_SITE_OTHER): Payer: Medicaid Other | Admitting: Family Medicine

## 2022-04-11 VITALS — BP 93/51 | HR 120

## 2022-04-11 DIAGNOSIS — Z3480 Encounter for supervision of other normal pregnancy, unspecified trimester: Secondary | ICD-10-CM

## 2022-04-11 DIAGNOSIS — Q970 Karyotype 47, XXX: Secondary | ICD-10-CM

## 2022-04-11 DIAGNOSIS — O2302 Infections of kidney in pregnancy, second trimester: Secondary | ICD-10-CM

## 2022-04-11 DIAGNOSIS — Z3A30 30 weeks gestation of pregnancy: Secondary | ICD-10-CM

## 2022-04-11 MED ORDER — NITROFURANTOIN MONOHYD MACRO 100 MG PO CAPS: 100.0000 mg | ORAL_CAPSULE | Freq: Every day | ORAL | 1 refills | Status: DC

## 2022-04-11 NOTE — Progress Notes (Signed)
   PRENATAL VISIT NOTE  Subjective:  Melissa Farley is a 21 y.o. G2P0010 at [redacted]w[redacted]d being seen today for ongoing prenatal care.  She is currently monitored for the following issues for this high-risk pregnancy and has ADHD (attention deficit hyperactivity disorder); Allergic rhinitis; Fatigue; Generalized headaches; Inattention; Child in care of non-parental family member; PTSD (post-traumatic stress disorder); Pain in thoracic spine; Scoliosis of thoracic spine; Stiffness of joint, pelvic region and thigh; Muscle weakness (generalized); Murmur; Vasovagal syncope; Irregular menses; Hair loss; BMI (body mass index), pediatric, less than 5th percentile for age; Dermatitis due to cat hair; Generalized abdominal pain; Gastroesophageal reflux disease without esophagitis; Supervision of other normal pregnancy, antepartum; Abnormal genetic test during pregnancy; Trisomy X syndrome; UTI (urinary tract infection); and Pyelonephritis affecting pregnancy in second trimester on their problem list.  Patient reports no complaints.  Contractions: Not present. Vag. Bleeding: None.  Movement: Present. Denies leaking of fluid.   The following portions of the patient's history were reviewed and updated as appropriate: allergies, current medications, past family history, past medical history, past social history, past surgical history and problem list.   Objective:   Vitals:   04/11/22 1408  BP: (!) 93/51  Pulse: (!) 120    Fetal Status: Fetal Heart Rate (bpm): 130 Fundal Height: 30 cm Movement: Present     General:  Alert, oriented and cooperative. Patient is in no acute distress.  Skin: Skin is warm and dry. No rash noted.   Cardiovascular: Normal heart rate noted  Respiratory: Normal respiratory effort, no problems with respiration noted  Abdomen: Soft, gravid, appropriate for gestational age.  Pain/Pressure: Present     Pelvic: Cervical exam deferred        Extremities: Normal range of motion.  Edema:  None  Mental Status: Normal mood and affect. Normal behavior. Normal judgment and thought content.   Assessment and Plan:  Pregnancy: G2P0010 at [redacted]w[redacted]d 1. Supervision of other normal pregnancy, antepartum EFW 14%. FHT and FH normal F/u US in 4 weeks.  2. [redacted] weeks gestation of pregnancy  3. Trisomy X syndrome Maternal  4. Pyelonephritis affecting pregnancy in second trimester On macrobid for prophylaxis.   Preterm labor symptoms and general obstetric precautions including but not limited to vaginal bleeding, contractions, leaking of fluid and fetal movement were reviewed in detail with the patient. Please refer to After Visit Summary for other counseling recommendations.   No follow-ups on file.  Future Appointments  Date Time Provider [place]  04/25/2022  1:30 PM Truett Mainland, DO [facility] None  05/08/2022 11:15 AM [facility] NURSE [facility]  05/08/2022 11:30 AM [facility] US3 [facility]  05/09/2022  1:30 PM Truett Mainland, DO [facility] None  05/23/2022  1:30 PM Truett Mainland, DO [facility] None  05/30/2022  1:30 PM Truett Mainland, DO [facility] None  06/05/2022 11:15 AM Darliss Cheney, MD [facility] None  06/13/2022  1:30 PM Truett Mainland, DO [facility] None    Truett Mainland, DO

## 2022-04-25 ENCOUNTER — Ambulatory Visit (INDEPENDENT_AMBULATORY_CARE_PROVIDER_SITE_OTHER): Payer: Medicaid Other | Admitting: Family Medicine

## 2022-04-25 VITALS — BP 98/62 | HR 92 | Wt 123.0 lb

## 2022-04-25 DIAGNOSIS — Z68.41 Body mass index (BMI) pediatric, less than 5th percentile for age: Secondary | ICD-10-CM

## 2022-04-25 DIAGNOSIS — Z3480 Encounter for supervision of other normal pregnancy, unspecified trimester: Secondary | ICD-10-CM

## 2022-04-25 DIAGNOSIS — Q970 Karyotype 47, XXX: Secondary | ICD-10-CM

## 2022-04-25 DIAGNOSIS — O2302 Infections of kidney in pregnancy, second trimester: Secondary | ICD-10-CM

## 2022-04-25 DIAGNOSIS — O2303 Infections of kidney in pregnancy, third trimester: Secondary | ICD-10-CM

## 2022-04-25 DIAGNOSIS — Z3A32 32 weeks gestation of pregnancy: Secondary | ICD-10-CM

## 2022-04-25 NOTE — Progress Notes (Signed)
   PRENATAL VISIT NOTE  Subjective:  Melissa Farley is a 21 y.o. G2P0010 at [redacted]w[redacted]d being seen today for ongoing prenatal care.  She is currently monitored for the following issues for this high-risk pregnancy and has ADHD (attention deficit hyperactivity disorder); Allergic rhinitis; Fatigue; Generalized headaches; Inattention; Child in care of non-parental family member; PTSD (post-traumatic stress disorder); Pain in thoracic spine; Scoliosis of thoracic spine; Stiffness of joint, pelvic region and thigh; Muscle weakness (generalized); Murmur; Vasovagal syncope; Irregular menses; Hair loss; BMI (body mass index), pediatric, less than 5th percentile for age; Dermatitis due to cat hair; Generalized abdominal pain; Gastroesophageal reflux disease without esophagitis; Supervision of other normal pregnancy, antepartum; Abnormal genetic test during pregnancy; Trisomy X syndrome; UTI (urinary tract infection); and Pyelonephritis affecting pregnancy in second trimester on their problem list.  Patient reports no complaints.  Contractions: Not present. Vag. Bleeding: None.  Movement: Present. Denies leaking of fluid.   The following portions of the patient's history were reviewed and updated as appropriate: allergies, current medications, past family history, past medical history, past social history, past surgical history and problem list.   Objective:   Vitals:   04/25/22 1336  BP: 98/62  Pulse: 92  Weight: 123 lb (55.8 kg)    Fetal Status: Fetal Heart Rate (bpm): 135   Movement: Present     General:  Alert, oriented and cooperative. Patient is in no acute distress.  Skin: Skin is warm and dry. No rash noted.   Cardiovascular: Normal heart rate noted  Respiratory: Normal respiratory effort, no problems with respiration noted  Abdomen: Soft, gravid, appropriate for gestational age.  Pain/Pressure: Present     Pelvic: Cervical exam deferred        Extremities: Normal range of motion.  Edema:  None  Mental Status: Normal mood and affect. Normal behavior. Normal judgment and thought content.   Assessment and Plan:  Pregnancy: G2P0010 at [redacted]w[redacted]d 1. Supervision of other normal pregnancy, antepartum FHT and FH normal Has growth [place] on 10/25 EFW 14%  2. Trisomy X syndrome maternal  3. BMI (body mass index), pediatric, less than 5th percentile for age  60. Pyelonephritis affecting pregnancy in second trimester On ppx   Preterm labor symptoms and general obstetric precautions including but not limited to vaginal bleeding, contractions, leaking of fluid and fetal movement were reviewed in detail with the patient. Please refer to After Visit Summary for other counseling recommendations.   No follow-ups on file.  Future Appointments  Date Time Provider [place]  05/08/2022 11:15 AM [facility] NURSE [facility]  05/08/2022 11:30 AM [facility] US3 [facility]  05/09/2022  1:30 PM Truett Mainland, DO [facility] None  05/23/2022  1:30 PM Truett Mainland, DO [facility] None  05/30/2022  1:30 PM Truett Mainland, DO [facility] None  06/05/2022 11:15 AM Darliss Cheney, MD [facility] None  06/13/2022  1:30 PM Truett Mainland, DO [facility] None    Truett Mainland, DO

## 2022-05-01 DIAGNOSIS — Z1388 Encounter for screening for disorder due to exposure to contaminants: Secondary | ICD-10-CM | POA: Diagnosis not present

## 2022-05-08 ENCOUNTER — Ambulatory Visit: Payer: Medicaid Other | Attending: Obstetrics and Gynecology

## 2022-05-08 ENCOUNTER — Other Ambulatory Visit: Payer: Self-pay | Admitting: Obstetrics and Gynecology

## 2022-05-08 ENCOUNTER — Ambulatory Visit: Payer: Medicaid Other | Admitting: *Deleted

## 2022-05-08 ENCOUNTER — Other Ambulatory Visit: Payer: Self-pay | Admitting: *Deleted

## 2022-05-08 VITALS — BP 115/68 | HR 94

## 2022-05-08 DIAGNOSIS — O3503X0 Maternal care for (suspected) central nervous system malformation or damage in fetus, choroid plexus cysts, not applicable or unspecified: Secondary | ICD-10-CM | POA: Diagnosis not present

## 2022-05-08 DIAGNOSIS — O3503X1 Maternal care for (suspected) central nervous system malformation or damage in fetus, choroid plexus cysts, fetus 1: Secondary | ICD-10-CM

## 2022-05-08 DIAGNOSIS — Z3A34 34 weeks gestation of pregnancy: Secondary | ICD-10-CM

## 2022-05-08 DIAGNOSIS — Z362 Encounter for other antenatal screening follow-up: Secondary | ICD-10-CM

## 2022-05-08 DIAGNOSIS — Q999 Chromosomal abnormality, unspecified: Secondary | ICD-10-CM | POA: Diagnosis not present

## 2022-05-08 DIAGNOSIS — O281 Abnormal biochemical finding on antenatal screening of mother: Secondary | ICD-10-CM

## 2022-05-08 DIAGNOSIS — O285 Abnormal chromosomal and genetic finding on antenatal screening of mother: Secondary | ICD-10-CM | POA: Diagnosis not present

## 2022-05-08 DIAGNOSIS — Z3689 Encounter for other specified antenatal screening: Secondary | ICD-10-CM

## 2022-05-08 DIAGNOSIS — O280 Abnormal hematological finding on antenatal screening of mother: Secondary | ICD-10-CM

## 2022-05-09 ENCOUNTER — Ambulatory Visit (INDEPENDENT_AMBULATORY_CARE_PROVIDER_SITE_OTHER): Payer: Medicaid Other | Admitting: Family Medicine

## 2022-05-09 VITALS — BP 116/81 | HR 101 | Wt 124.0 lb

## 2022-05-09 DIAGNOSIS — Z3A34 34 weeks gestation of pregnancy: Secondary | ICD-10-CM

## 2022-05-09 DIAGNOSIS — Z3480 Encounter for supervision of other normal pregnancy, unspecified trimester: Secondary | ICD-10-CM

## 2022-05-09 DIAGNOSIS — O365990 Maternal care for other known or suspected poor fetal growth, unspecified trimester, not applicable or unspecified: Secondary | ICD-10-CM

## 2022-05-09 DIAGNOSIS — O365930 Maternal care for other known or suspected poor fetal growth, third trimester, not applicable or unspecified: Secondary | ICD-10-CM

## 2022-05-09 DIAGNOSIS — Q970 Karyotype 47, XXX: Secondary | ICD-10-CM

## 2022-05-09 DIAGNOSIS — Z3483 Encounter for supervision of other normal pregnancy, third trimester: Secondary | ICD-10-CM

## 2022-05-09 DIAGNOSIS — M419 Scoliosis, unspecified: Secondary | ICD-10-CM

## 2022-05-09 NOTE — Progress Notes (Signed)
Pt presents for ROB visit. Pt c/o nose bleeds over the last week.

## 2022-05-09 NOTE — Progress Notes (Signed)
   PRENATAL VISIT NOTE  Subjective:  Melissa Farley is a 21 y.o. G2P0010 at [redacted]w[redacted]d being seen today for ongoing prenatal care.  She is currently monitored for the following issues for this high-risk pregnancy and has ADHD (attention deficit hyperactivity disorder); Allergic rhinitis; Fatigue; Generalized headaches; Inattention; Child in care of non-parental family member; PTSD (post-traumatic stress disorder); Pain in thoracic spine; Scoliosis of thoracic spine; Stiffness of joint, pelvic region and thigh; Muscle weakness (generalized); Murmur; Vasovagal syncope; Irregular menses; Hair loss; BMI (body mass index), pediatric, less than 5th percentile for age; Dermatitis due to cat hair; Generalized abdominal pain; Gastroesophageal reflux disease without esophagitis; Supervision of other normal pregnancy, antepartum; Abnormal genetic test during pregnancy; Trisomy X syndrome; UTI (urinary tract infection); and Pyelonephritis affecting pregnancy in second trimester on their problem list.  Patient reports no complaints.  Contractions: Not present. Vag. Bleeding: None.  Movement: Present. Denies leaking of fluid.   The following portions of the patient's history were reviewed and updated as appropriate: allergies, current medications, past family history, past medical history, past social history, past surgical history and problem list.   Objective:   Vitals:   05/09/22 1337  BP: 116/81  Pulse: (!) 101  Weight: 124 lb (56.2 kg)    Fetal Status: Fetal Heart Rate (bpm): 143 Fundal Height: 33 cm Movement: Present  Presentation: Vertex  General:  Alert, oriented and cooperative. Patient is in no acute distress.  Skin: Skin is warm and dry. No rash noted.   Cardiovascular: Normal heart rate noted  Respiratory: Normal respiratory effort, no problems with respiration noted  Abdomen: Soft, gravid, appropriate for gestational age.  Pain/Pressure: Absent     Pelvic: Cervical exam deferred         Extremities: Normal range of motion.  Edema: None  Mental Status: Normal mood and affect. Normal behavior. Normal judgment and thought content.   Assessment and Plan:  Pregnancy: G2P0010 at [redacted]w[redacted]d 1. Supervision of other normal pregnancy, antepartum FHT normal  2. Trisomy X syndrome Normal NIPs  3. Scoliosis of thoracic spine, unspecified scoliosis type  4. Fetal growth restriction antepartum AC 5%.EFW 11% Cord doppers normal Continue antenatal screening Growth [place] in 3 weeks  Preterm labor symptoms and general obstetric precautions including but not limited to vaginal bleeding, contractions, leaking of fluid and fetal movement were reviewed in detail with the patient. Please refer to After Visit Summary for other counseling recommendations.   No follow-ups on file.  Future Appointments  Date Time Provider [place]  05/15/2022  1:30 PM [facility] NURSE [facility]  05/15/2022  1:45 PM [facility] US4 [facility]  05/21/2022 12:45 PM [facility] NURSE [facility]  05/21/2022  1:00 PM [facility] US1 [facility]  05/23/2022  1:30 PM Truett Mainland, DO [facility] None  05/28/2022 10:15 AM [facility] NURSE [facility]  05/28/2022 10:30 AM [facility] US2 [facility]  05/30/2022  1:30 PM Truett Mainland, DO [facility] None  06/04/2022  1:30 PM [facility] NURSE [facility]  06/04/2022  1:45 PM [facility] US6 [facility]  06/05/2022 11:15 AM Darliss Cheney, MD [facility] None  06/13/2022  1:30 PM Truett Mainland, DO [facility] None    Truett Mainland, DO

## 2022-05-15 ENCOUNTER — Ambulatory Visit: Payer: Medicaid Other | Admitting: *Deleted

## 2022-05-15 ENCOUNTER — Ambulatory Visit: Payer: Medicaid Other | Attending: Obstetrics

## 2022-05-15 VITALS — BP 112/62 | HR 85

## 2022-05-15 DIAGNOSIS — Z364 Encounter for antenatal screening for fetal growth retardation: Secondary | ICD-10-CM | POA: Diagnosis not present

## 2022-05-15 DIAGNOSIS — Z3A35 35 weeks gestation of pregnancy: Secondary | ICD-10-CM | POA: Diagnosis not present

## 2022-05-15 DIAGNOSIS — O289 Unspecified abnormal findings on antenatal screening of mother: Secondary | ICD-10-CM | POA: Diagnosis not present

## 2022-05-15 DIAGNOSIS — O358XX0 Maternal care for other (suspected) fetal abnormality and damage, not applicable or unspecified: Secondary | ICD-10-CM | POA: Insufficient documentation

## 2022-05-15 DIAGNOSIS — O365930 Maternal care for other known or suspected poor fetal growth, third trimester, not applicable or unspecified: Secondary | ICD-10-CM | POA: Insufficient documentation

## 2022-05-15 DIAGNOSIS — O359XX0 Maternal care for (suspected) fetal abnormality and damage, unspecified, not applicable or unspecified: Secondary | ICD-10-CM

## 2022-05-15 DIAGNOSIS — Z3480 Encounter for supervision of other normal pregnancy, unspecified trimester: Secondary | ICD-10-CM

## 2022-05-15 DIAGNOSIS — O280 Abnormal hematological finding on antenatal screening of mother: Secondary | ICD-10-CM

## 2022-05-15 DIAGNOSIS — Z148 Genetic carrier of other disease: Secondary | ICD-10-CM | POA: Insufficient documentation

## 2022-05-21 ENCOUNTER — Ambulatory Visit: Payer: Medicaid Other | Attending: Obstetrics and Gynecology

## 2022-05-21 ENCOUNTER — Ambulatory Visit: Payer: Medicaid Other | Admitting: *Deleted

## 2022-05-21 VITALS — BP 99/60 | HR 90

## 2022-05-21 DIAGNOSIS — Z3480 Encounter for supervision of other normal pregnancy, unspecified trimester: Secondary | ICD-10-CM | POA: Insufficient documentation

## 2022-05-21 DIAGNOSIS — O365930 Maternal care for other known or suspected poor fetal growth, third trimester, not applicable or unspecified: Secondary | ICD-10-CM

## 2022-05-21 DIAGNOSIS — Z3A36 36 weeks gestation of pregnancy: Secondary | ICD-10-CM | POA: Diagnosis not present

## 2022-05-21 DIAGNOSIS — O280 Abnormal hematological finding on antenatal screening of mother: Secondary | ICD-10-CM | POA: Diagnosis not present

## 2022-05-21 DIAGNOSIS — Z148 Genetic carrier of other disease: Secondary | ICD-10-CM

## 2022-05-21 DIAGNOSIS — O281 Abnormal biochemical finding on antenatal screening of mother: Secondary | ICD-10-CM | POA: Diagnosis not present

## 2022-05-21 DIAGNOSIS — O285 Abnormal chromosomal and genetic finding on antenatal screening of mother: Secondary | ICD-10-CM

## 2022-05-23 ENCOUNTER — Other Ambulatory Visit (HOSPITAL_COMMUNITY)
Admission: RE | Admit: 2022-05-23 | Discharge: 2022-05-23 | Disposition: A | Discharge status: Home / Self Care | Payer: Medicaid Other | Source: Ambulatory Visit | Attending: Family Medicine | Admitting: Family Medicine

## 2022-05-23 ENCOUNTER — Encounter: Payer: Self-pay | Admitting: General Practice

## 2022-05-23 ENCOUNTER — Ambulatory Visit (INDEPENDENT_AMBULATORY_CARE_PROVIDER_SITE_OTHER): Payer: Medicaid Other | Admitting: Family Medicine

## 2022-05-23 VITALS — BP 102/62 | HR 101 | Wt 126.0 lb

## 2022-05-23 DIAGNOSIS — Q970 Karyotype 47, XXX: Secondary | ICD-10-CM

## 2022-05-23 DIAGNOSIS — Z3A36 36 weeks gestation of pregnancy: Secondary | ICD-10-CM | POA: Diagnosis not present

## 2022-05-23 DIAGNOSIS — Z3480 Encounter for supervision of other normal pregnancy, unspecified trimester: Secondary | ICD-10-CM | POA: Insufficient documentation

## 2022-05-23 DIAGNOSIS — Z3483 Encounter for supervision of other normal pregnancy, third trimester: Secondary | ICD-10-CM

## 2022-05-23 DIAGNOSIS — O365930 Maternal care for other known or suspected poor fetal growth, third trimester, not applicable or unspecified: Secondary | ICD-10-CM

## 2022-05-23 DIAGNOSIS — O365990 Maternal care for other known or suspected poor fetal growth, unspecified trimester, not applicable or unspecified: Secondary | ICD-10-CM | POA: Insufficient documentation

## 2022-05-23 NOTE — Progress Notes (Signed)
   PRENATAL VISIT NOTE  Subjective:  Melissa Farley is a 21 y.o. G2P0010 at [redacted]w[redacted]d being seen today for ongoing prenatal care.  She is currently monitored for the following issues for this high-risk pregnancy and has ADHD (attention deficit hyperactivity disorder); Allergic rhinitis; Fatigue; Generalized headaches; Inattention; Child in care of non-parental family member; PTSD (post-traumatic stress disorder); Pain in thoracic spine; Scoliosis of thoracic spine; Stiffness of joint, pelvic region and thigh; Muscle weakness (generalized); Murmur; Vasovagal syncope; Irregular menses; Hair loss; BMI (body mass index), pediatric, less than 5th percentile for age; Dermatitis due to cat hair; Generalized abdominal pain; Gastroesophageal reflux disease without esophagitis; Supervision of other normal pregnancy, antepartum; Abnormal genetic test during pregnancy; Trisomy X syndrome; UTI (urinary tract infection); and Pyelonephritis affecting pregnancy in second trimester on their problem list.  Patient reports occasional contractions.  Contractions: Not present. Vag. Bleeding: None.  Movement: Present. Denies leaking of fluid.   The following portions of the patient's history were reviewed and updated as appropriate: allergies, current medications, past family history, past medical history, past social history, past surgical history and problem list.   Objective:   Vitals:   05/23/22 1334  BP: 102/62  Pulse: (!) 101  Weight: 126 lb (57.2 kg)    Fetal Status: Fetal Heart Rate (bpm): 132 Fundal Height: 35 cm Movement: Present     General:  Alert, oriented and cooperative. Patient is in no acute distress.  Skin: Skin is warm and dry. No rash noted.   Cardiovascular: Normal heart rate noted  Respiratory: Normal respiratory effort, no problems with respiration noted  Abdomen: Soft, gravid, appropriate for gestational age.  Pain/Pressure: Absent     Pelvic: Cervical exam deferred        Extremities:  Normal range of motion.  Edema: None  Mental Status: Normal mood and affect. Normal behavior. Normal judgment and thought content.   Assessment and Plan:  Pregnancy: G2P0010 at [redacted]w[redacted]d 1. Supervision of other normal pregnancy, antepartum FHT normal - Culture, beta strep (group b only) - GC/Chlamydia probe amp ([place])not at [facility]  2. Trisomy X syndrome maternal  3. Fetal growth restriction antepartum Has [place] scheduled next week. Dopplers normal Anticipate induction between 38-39 weeks.   Preterm labor symptoms and general obstetric precautions including but not limited to vaginal bleeding, contractions, leaking of fluid and fetal movement were reviewed in detail with the patient. Please refer to After Visit Summary for other counseling recommendations.   No follow-ups on file.  Future Appointments  Date Time Provider Department Center  05/28/2022 10:15 AM [facility] NURSE [facility]  05/28/2022 10:30 AM [facility] US2 [facility]  05/30/2022  1:30 PM Levie Heritage, DO [facility] None  06/04/2022  1:30 PM [facility] NURSE [facility]  06/04/2022  1:45 PM [facility] US6 [facility]  06/05/2022 11:15 AM Lorriane Shire, MD [facility] None  06/13/2022  1:30 PM Levie Heritage, DO [facility] None    Levie Heritage, DO

## 2022-05-24 LAB — GC/CHLAMYDIA PROBE AMP (~~LOC~~) NOT AT ARMC
Chlamydia: NEGATIVE
Comment: NEGATIVE
Comment: NORMAL
Neisseria Gonorrhea: NEGATIVE

## 2022-05-27 LAB — CULTURE, BETA STREP (GROUP B ONLY): Strep Gp B Culture: NEGATIVE

## 2022-05-28 ENCOUNTER — Ambulatory Visit: Payer: Medicaid Other | Admitting: *Deleted

## 2022-05-28 ENCOUNTER — Ambulatory Visit: Payer: Medicaid Other | Attending: Obstetrics

## 2022-05-28 VITALS — BP 126/67 | HR 93

## 2022-05-28 DIAGNOSIS — Z3480 Encounter for supervision of other normal pregnancy, unspecified trimester: Secondary | ICD-10-CM | POA: Insufficient documentation

## 2022-05-28 DIAGNOSIS — Z362 Encounter for other antenatal screening follow-up: Secondary | ICD-10-CM | POA: Diagnosis not present

## 2022-05-28 DIAGNOSIS — O365930 Maternal care for other known or suspected poor fetal growth, third trimester, not applicable or unspecified: Secondary | ICD-10-CM | POA: Insufficient documentation

## 2022-05-28 DIAGNOSIS — O280 Abnormal hematological finding on antenatal screening of mother: Secondary | ICD-10-CM | POA: Insufficient documentation

## 2022-05-28 DIAGNOSIS — O285 Abnormal chromosomal and genetic finding on antenatal screening of mother: Secondary | ICD-10-CM

## 2022-05-28 DIAGNOSIS — O289 Unspecified abnormal findings on antenatal screening of mother: Secondary | ICD-10-CM | POA: Diagnosis not present

## 2022-05-28 DIAGNOSIS — O358XX0 Maternal care for other (suspected) fetal abnormality and damage, not applicable or unspecified: Secondary | ICD-10-CM | POA: Insufficient documentation

## 2022-05-28 DIAGNOSIS — Z3A37 37 weeks gestation of pregnancy: Secondary | ICD-10-CM | POA: Insufficient documentation

## 2022-05-28 DIAGNOSIS — Z148 Genetic carrier of other disease: Secondary | ICD-10-CM | POA: Diagnosis not present

## 2022-05-28 DIAGNOSIS — O281 Abnormal biochemical finding on antenatal screening of mother: Secondary | ICD-10-CM

## 2022-05-30 ENCOUNTER — Ambulatory Visit (INDEPENDENT_AMBULATORY_CARE_PROVIDER_SITE_OTHER): Payer: Medicaid Other | Admitting: Family Medicine

## 2022-05-30 VITALS — BP 120/71 | HR 90 | Wt 131.0 lb

## 2022-05-30 DIAGNOSIS — O365990 Maternal care for other known or suspected poor fetal growth, unspecified trimester, not applicable or unspecified: Secondary | ICD-10-CM

## 2022-05-30 DIAGNOSIS — Z3A37 37 weeks gestation of pregnancy: Secondary | ICD-10-CM

## 2022-05-30 DIAGNOSIS — Q970 Karyotype 47, XXX: Secondary | ICD-10-CM

## 2022-05-30 DIAGNOSIS — Z3483 Encounter for supervision of other normal pregnancy, third trimester: Secondary | ICD-10-CM

## 2022-05-30 DIAGNOSIS — Z3480 Encounter for supervision of other normal pregnancy, unspecified trimester: Secondary | ICD-10-CM

## 2022-05-30 DIAGNOSIS — O365930 Maternal care for other known or suspected poor fetal growth, third trimester, not applicable or unspecified: Secondary | ICD-10-CM

## 2022-05-30 NOTE — Progress Notes (Signed)
   PRENATAL VISIT NOTE  Subjective:  Melissa Farley is a 21 y.o. G2P0010 at [redacted]w[redacted]d being seen today for ongoing prenatal care.  She is currently monitored for the following issues for this high-risk pregnancy and has ADHD (attention deficit hyperactivity disorder); Allergic rhinitis; Fatigue; Generalized headaches; Inattention; Child in care of non-parental family member; PTSD (post-traumatic stress disorder); Pain in thoracic spine; Scoliosis of thoracic spine; Stiffness of joint, pelvic region and thigh; Muscle weakness (generalized); Murmur; Vasovagal syncope; Irregular menses; Hair loss; BMI (body mass index), pediatric, less than 5th percentile for age; Dermatitis due to cat hair; Generalized abdominal pain; Gastroesophageal reflux disease without esophagitis; Supervision of other normal pregnancy, antepartum; Abnormal genetic test during pregnancy; Trisomy X syndrome; UTI (urinary tract infection); Pyelonephritis affecting pregnancy in second trimester; and Fetal growth restriction antepartum on their problem list.  Patient reports no complaints.  Contractions: Not present. Vag. Bleeding: None.  Movement: Present. Denies leaking of fluid.   The following portions of the patient's history were reviewed and updated as appropriate: allergies, current medications, past family history, past medical history, past social history, past surgical history and problem list.   Objective:   Vitals:   05/30/22 1326  BP: 120/71  Pulse: 90  Weight: 131 lb (59.4 kg)    Fetal Status: Fetal Heart Rate (bpm): 165   Movement: Present     General:  Alert, oriented and cooperative. Patient is in no acute distress.  Skin: Skin is warm and dry. No rash noted.   Cardiovascular: Normal heart rate noted  Respiratory: Normal respiratory effort, no problems with respiration noted  Abdomen: Soft, gravid, appropriate for gestational age.  Pain/Pressure: Present (lower round ligament pain)     Pelvic: Cervical exam  deferred        Extremities: Normal range of motion.  Edema: None  Mental Status: Normal mood and affect. Normal behavior. Normal judgment and thought content.   Assessment and Plan:  Pregnancy: G2P0010 at [redacted]w[redacted]d 1. [redacted] weeks gestation of pregnancy  2. Supervision of other normal pregnancy, antepartum FHT normal  3. Trisomy X syndrome  4. Fetal growth restriction antepartum Resolved - last US shows EFW and AC at 14% MFM recommend induction between 39-40 weeks.  Term labor symptoms and general obstetric precautions including but not limited to vaginal bleeding, contractions, leaking of fluid and fetal movement were reviewed in detail with the patient. Please refer to After Visit Summary for other counseling recommendations.   No follow-ups on file.  Future Appointments  Date Time Provider Department Center  06/04/2022  1:30 PM [facility] NURSE [facility]  06/04/2022  1:45 PM [facility] US6 [facility]  06/05/2022 11:15 AM Lorriane Shire, MD [facility] None  06/13/2022  1:30 PM Levie Heritage, DO [facility] None    Levie Heritage, DO

## 2022-05-31 IMAGING — US US OB < 14 WEEKS - US OB TV
1 series · 15 of 28 positions shown · non-contrast
Comparison: None.

CLINICAL DATA: Pregnant patient in early pregnancy with vaginal
bleeding. Beta hCG pending. Gestational age by LMP is 7 weeks 1 day.

EXAM:
OBSTETRIC <14 WK US AND TRANSVAGINAL OB US
TECHNIQUE: Both transabdominal and transvaginal ultrasound examinations were
performed for complete evaluation of the gestation as well as the
maternal uterus, adnexal regions, and pelvic cul-de-sac.
Transvaginal technique was performed to assess early pregnancy.

[Series 1: us ob < 14 weeks - us ob tv · 15 of 42 slices shown]
[im 1/42]
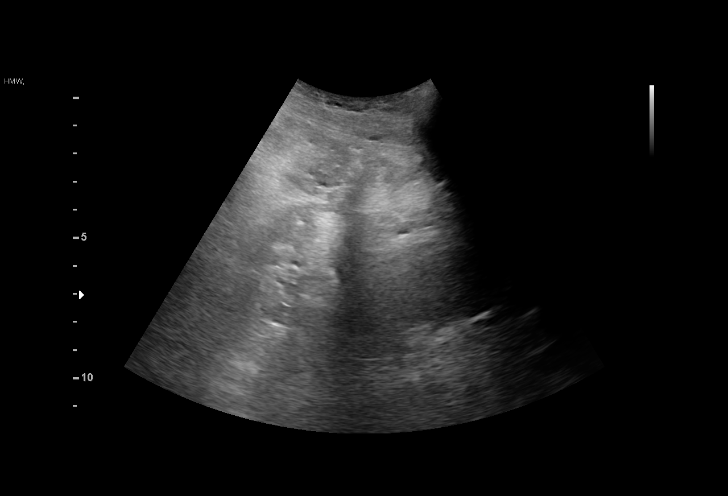
[im 4/42]
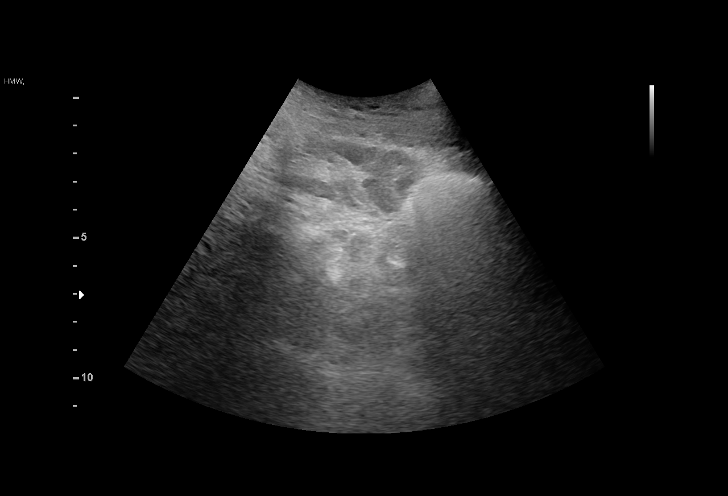
[im 7/42]
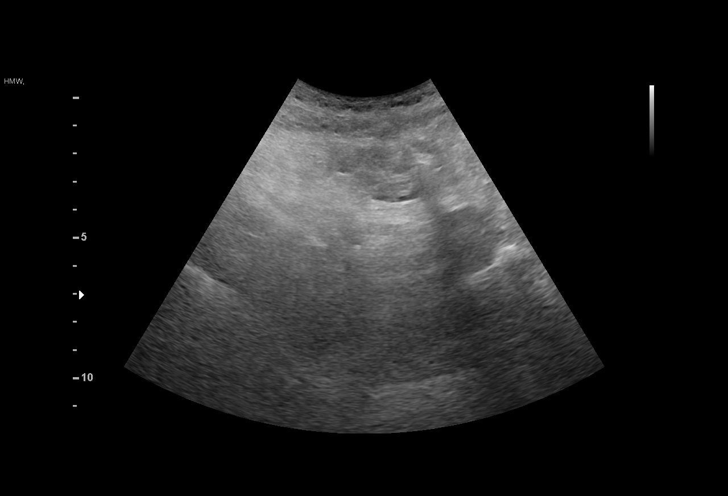
[im 10/42]
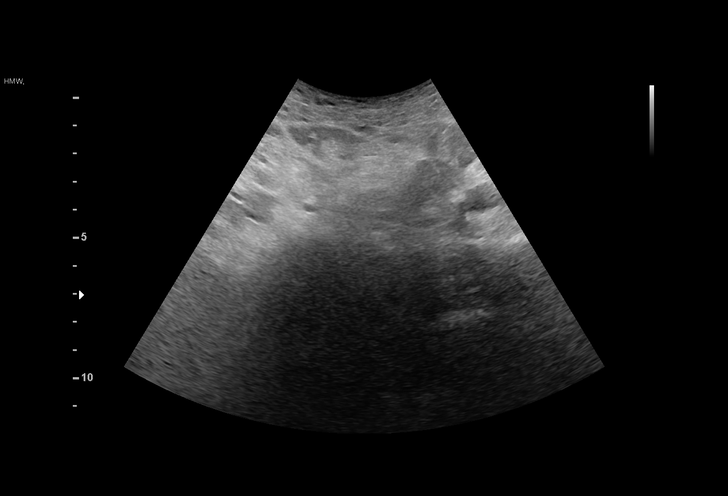
[im 13/42]
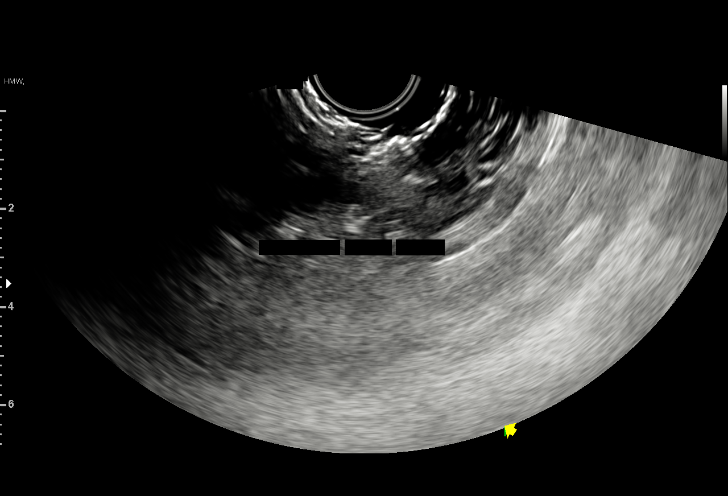
[im 16/42]
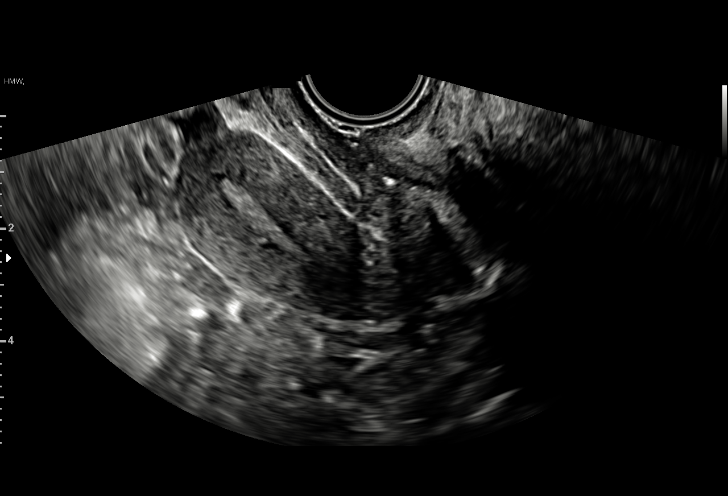
[im 19/42]
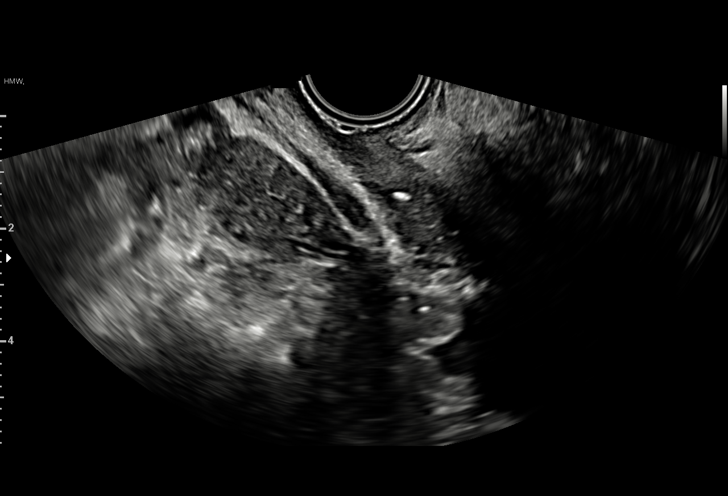
[im 22/42]
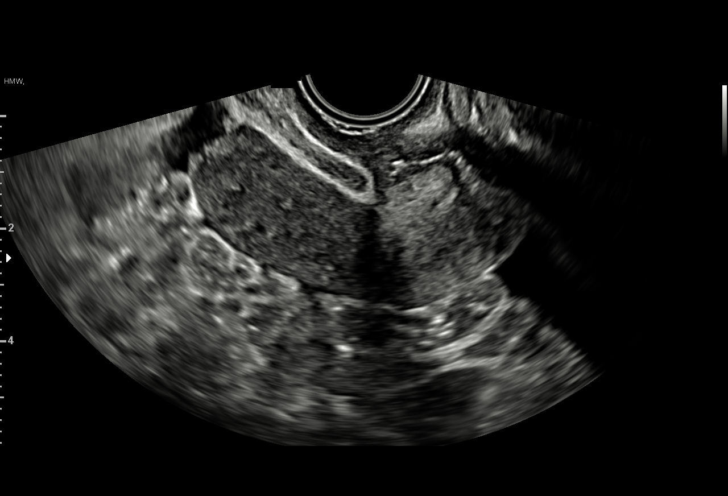
[im 23/42]
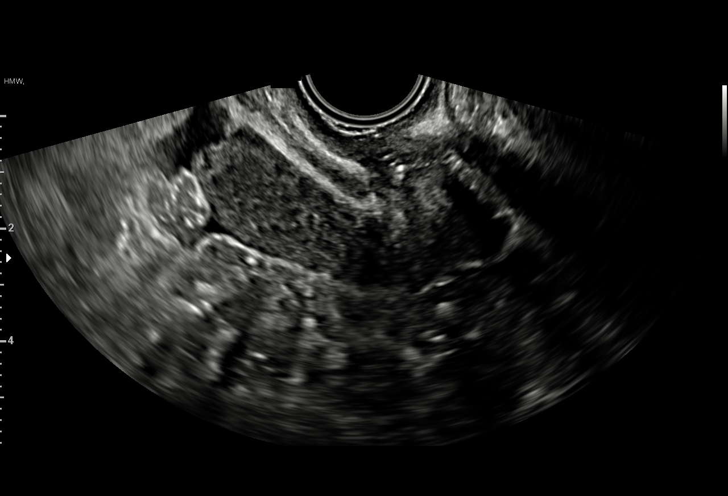
[im 26/42]
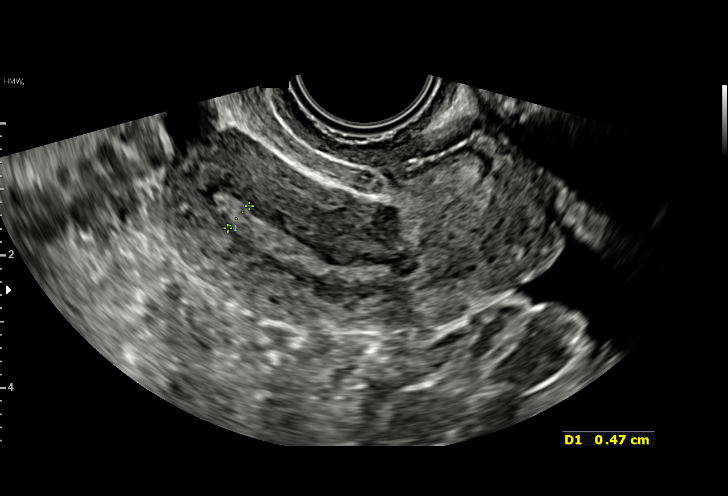
[im 29/42]
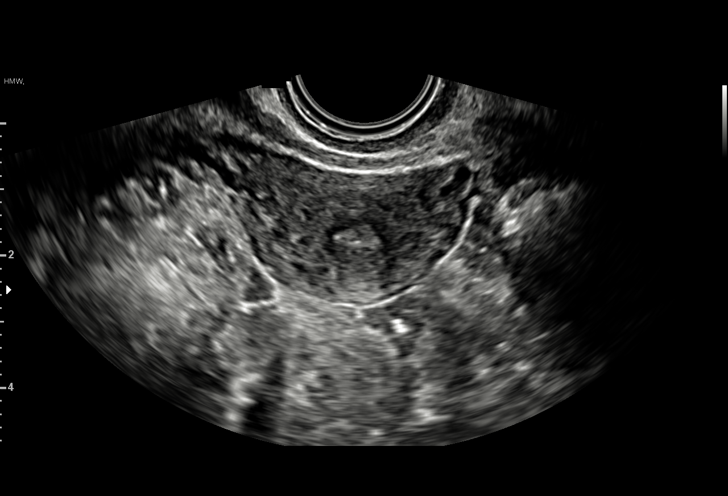
[im 32/42]
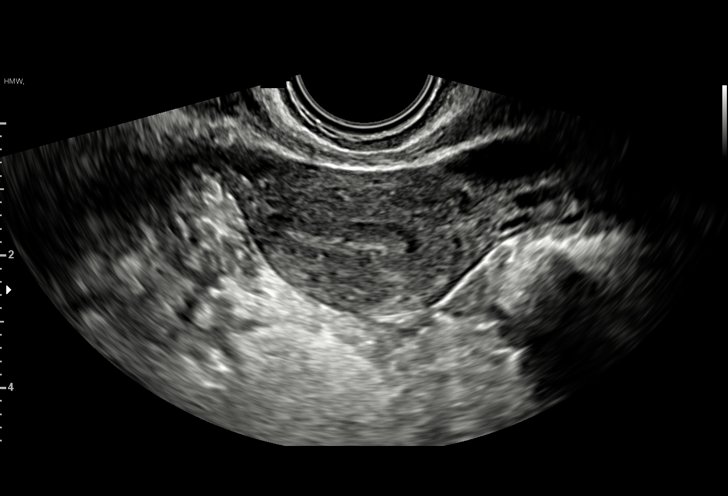
[im 35/42]
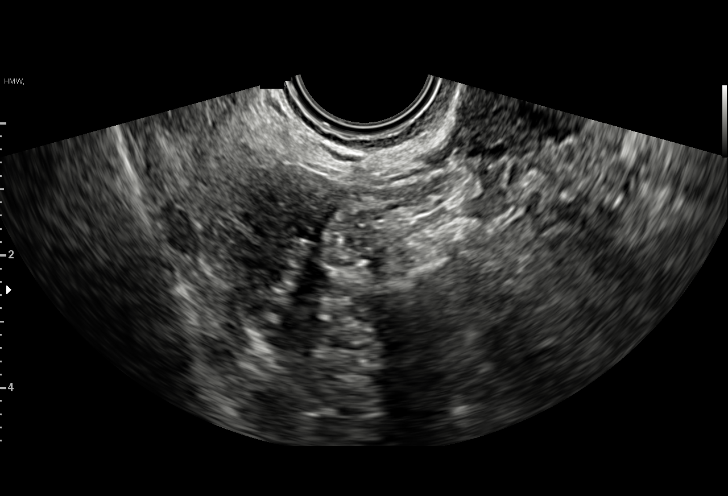
[im 38/42]
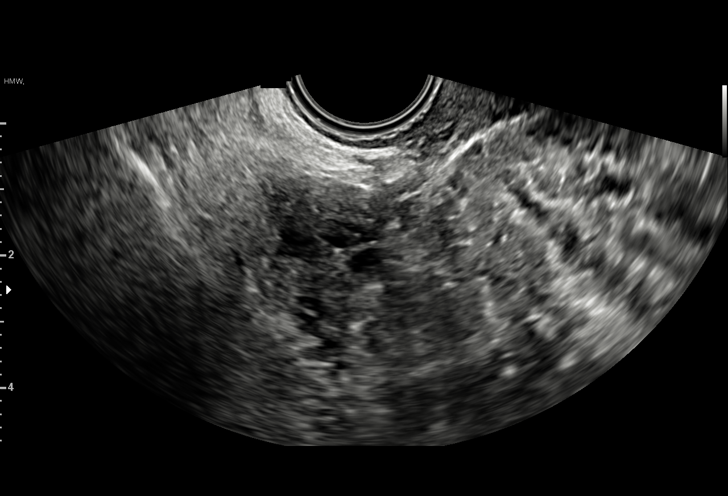
[im 42/42]
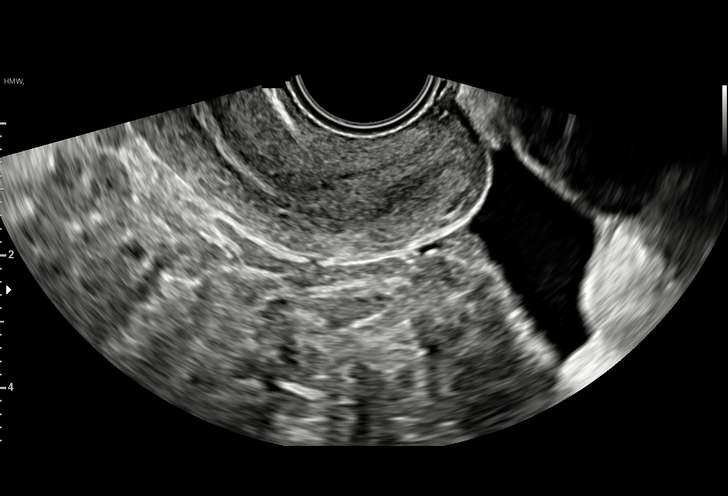

[15 of 28 positions shown; findings below may reference images not displayed]

FINDINGS: Intrauterine gestational sac: None

Yolk sac:  Not Visualized.

Embryo:  Not Visualized.

Cardiac Activity:

Maternal uterus/adnexae: The uterus is anteverted. The endometrium
measures 5 mm. There is no fluid in the endometrial canal. The right
ovary measures 2.3 x 1.4 x 1.9 cm and appears normal. The left ovary
is not visualized. No adnexal mass. Small volume of free fluid in
the cul-de-sac appears simple.

Technologist notes that patient did not tolerate transvaginal
imaging very well, and transabdominal evaluation is limited.
IMPRESSION: No intrauterine pregnancy or findings suspicious for ectopic
pregnancy. Findings are consistent with pregnancy of unknown
location and may reflect early intrauterine pregnancy not yet
visualized sonographically, occult ectopic pregnancy, or failed
pregnancy. Recommend trending of beta HCG and follow-up ultrasound
in 7-10 days based on HCG trends.

## 2022-06-04 ENCOUNTER — Encounter: Payer: Self-pay | Admitting: *Deleted

## 2022-06-04 ENCOUNTER — Ambulatory Visit: Payer: Medicaid Other | Admitting: *Deleted

## 2022-06-04 ENCOUNTER — Ambulatory Visit: Payer: Medicaid Other | Attending: Obstetrics

## 2022-06-04 VITALS — BP 112/54 | HR 106

## 2022-06-04 DIAGNOSIS — Z148 Genetic carrier of other disease: Secondary | ICD-10-CM | POA: Diagnosis not present

## 2022-06-04 DIAGNOSIS — O289 Unspecified abnormal findings on antenatal screening of mother: Secondary | ICD-10-CM | POA: Diagnosis not present

## 2022-06-04 DIAGNOSIS — O365930 Maternal care for other known or suspected poor fetal growth, third trimester, not applicable or unspecified: Secondary | ICD-10-CM | POA: Diagnosis not present

## 2022-06-04 DIAGNOSIS — O359XX0 Maternal care for (suspected) fetal abnormality and damage, unspecified, not applicable or unspecified: Secondary | ICD-10-CM

## 2022-06-04 DIAGNOSIS — Z3480 Encounter for supervision of other normal pregnancy, unspecified trimester: Secondary | ICD-10-CM | POA: Insufficient documentation

## 2022-06-04 DIAGNOSIS — Z3A38 38 weeks gestation of pregnancy: Secondary | ICD-10-CM | POA: Insufficient documentation

## 2022-06-04 DIAGNOSIS — O99891 Other specified diseases and conditions complicating pregnancy: Secondary | ICD-10-CM

## 2022-06-04 DIAGNOSIS — O280 Abnormal hematological finding on antenatal screening of mother: Secondary | ICD-10-CM | POA: Insufficient documentation

## 2022-06-04 DIAGNOSIS — O358XX0 Maternal care for other (suspected) fetal abnormality and damage, not applicable or unspecified: Secondary | ICD-10-CM | POA: Insufficient documentation

## 2022-06-05 ENCOUNTER — Ambulatory Visit (INDEPENDENT_AMBULATORY_CARE_PROVIDER_SITE_OTHER): Payer: Medicaid Other | Admitting: Obstetrics and Gynecology

## 2022-06-05 VITALS — BP 105/68 | HR 93 | Wt 131.0 lb

## 2022-06-05 DIAGNOSIS — Z3A38 38 weeks gestation of pregnancy: Secondary | ICD-10-CM

## 2022-06-05 DIAGNOSIS — Z3483 Encounter for supervision of other normal pregnancy, third trimester: Secondary | ICD-10-CM

## 2022-06-05 NOTE — Progress Notes (Signed)
   PRENATAL VISIT NOTE  Subjective:  Melissa Farley is a 21 y.o. G2P0010 at [redacted]w[redacted]d being seen today for ongoing prenatal care.  She is currently monitored for the following issues for this high-risk pregnancy and has ADHD (attention deficit hyperactivity disorder); Allergic rhinitis; Fatigue; Generalized headaches; Inattention; Child in care of non-parental family member; PTSD (post-traumatic stress disorder); Pain in thoracic spine; Scoliosis of thoracic spine; Stiffness of joint, pelvic region and thigh; Muscle weakness (generalized); Murmur; Vasovagal syncope; Irregular menses; Hair loss; BMI (body mass index), pediatric, less than 5th percentile for age; Dermatitis due to cat hair; Generalized abdominal pain; Gastroesophageal reflux disease without esophagitis; Supervision of other normal pregnancy, antepartum; Abnormal genetic test during pregnancy; Trisomy X syndrome; UTI (urinary tract infection); Pyelonephritis affecting pregnancy in second trimester; and Fetal growth restriction antepartum on their problem list.  Patient reports no complaints.  Contractions: Not present. Vag. Bleeding: None.  Movement: Present. Denies leaking of fluid.   The following portions of the patient's history were reviewed and updated as appropriate: allergies, current medications, past family history, past medical history, past social history, past surgical history and problem list.   Objective:   Vitals:   06/05/22 1113  BP: 105/68  Pulse: 93  Weight: 131 lb (59.4 kg)    Fetal Status: Fetal Heart Rate (bpm): 150   Movement: Present     General:  Alert, oriented and cooperative. Patient is in no acute distress.  Skin: Skin is warm and dry. No rash noted.   Cardiovascular: Normal heart rate noted  Respiratory: Normal respiratory effort, no problems with respiration noted  Abdomen: Soft, gravid, appropriate for gestational age.  Pain/Pressure: Absent     Pelvic: Cervical exam deferred         Extremities: Normal range of motion.  Edema: None  Mental Status: Normal mood and affect. Normal behavior. Normal judgment and thought content.   Assessment and Plan:  Pregnancy: G2P0010 at [redacted]w[redacted]d 1. [redacted] weeks gestation of pregnancy Answered all questions related to IOL, delivery timing, etc Advised L&D visitor policy may change with URI season   Term labor symptoms and general obstetric precautions including but not limited to vaginal bleeding, contractions, leaking of fluid and fetal movement were reviewed in detail with the patient. Please refer to After Visit Summary for other counseling recommendations.   No follow-ups on file.  Future Appointments  Date Time Provider Department Center  06/13/2022  1:30 PM Levie Heritage, DO [facility] None    Lorriane Shire, MD

## 2022-06-13 ENCOUNTER — Ambulatory Visit (INDEPENDENT_AMBULATORY_CARE_PROVIDER_SITE_OTHER): Payer: Medicaid Other | Admitting: Family Medicine

## 2022-06-13 VITALS — BP 113/76 | HR 101 | Wt 134.0 lb

## 2022-06-13 DIAGNOSIS — Q970 Karyotype 47, XXX: Secondary | ICD-10-CM

## 2022-06-13 DIAGNOSIS — M419 Scoliosis, unspecified: Secondary | ICD-10-CM

## 2022-06-13 DIAGNOSIS — Z3A39 39 weeks gestation of pregnancy: Secondary | ICD-10-CM

## 2022-06-13 DIAGNOSIS — Z3483 Encounter for supervision of other normal pregnancy, third trimester: Secondary | ICD-10-CM

## 2022-06-13 DIAGNOSIS — M546 Pain in thoracic spine: Secondary | ICD-10-CM

## 2022-06-13 DIAGNOSIS — Z3480 Encounter for supervision of other normal pregnancy, unspecified trimester: Secondary | ICD-10-CM

## 2022-06-13 NOTE — Progress Notes (Signed)
   PRENATAL VISIT NOTE  Subjective:  Melissa Farley is a 21 y.o. G2P0010 at [redacted]w[redacted]d being seen today for ongoing prenatal care.  She is currently monitored for the following issues for this high-risk pregnancy and has ADHD (attention deficit hyperactivity disorder); Allergic rhinitis; Fatigue; Generalized headaches; Inattention; Child in care of non-parental family member; PTSD (post-traumatic stress disorder); Pain in thoracic spine; Scoliosis of thoracic spine; Stiffness of joint, pelvic region and thigh; Muscle weakness (generalized); Murmur; Vasovagal syncope; Irregular menses; Hair loss; BMI (body mass index), pediatric, less than 5th percentile for age; Dermatitis due to cat hair; Generalized abdominal pain; Gastroesophageal reflux disease without esophagitis; Supervision of other normal pregnancy, antepartum; Abnormal genetic test during pregnancy; Trisomy X syndrome; UTI (urinary tract infection); Pyelonephritis affecting pregnancy in second trimester; and Fetal growth restriction antepartum on their problem list.  Patient reports no complaints.  Contractions: Not present. Vag. Bleeding: None.  Movement: Present. Denies leaking of fluid.   The following portions of the patient's history were reviewed and updated as appropriate: allergies, current medications, past family history, past medical history, past social history, past surgical history and problem list.   Objective:   Vitals:   06/13/22 1337  BP: 113/76  Pulse: (!) 101  Weight: 134 lb (60.8 kg)    Fetal Status:     Movement: Present     General:  Alert, oriented and cooperative. Patient is in no acute distress.  Skin: Skin is warm and dry. No rash noted.   Cardiovascular: Normal heart rate noted  Respiratory: Normal respiratory effort, no problems with respiration noted  Abdomen: Soft, gravid, appropriate for gestational age.  Pain/Pressure: Present     Pelvic: Cervical exam deferred        Extremities: Normal range of  motion.  Edema: None  Mental Status: Normal mood and affect. Normal behavior. Normal judgment and thought content.   Assessment and Plan:  Pregnancy: G2P0010 at [redacted]w[redacted]d 1. Supervision of other normal pregnancy, antepartum FHT and Fh normal Induce next week - CBC; Standing - Type and screen; Standing - RPR; Standing  2. Trisomy X syndrome  3. Scoliosis of thoracic spine, unspecified scoliosis type No h/o surgery    Term labor symptoms and general obstetric precautions including but not limited to vaginal bleeding, contractions, leaking of fluid and fetal movement were reviewed in detail with the patient. Please refer to After Visit Summary for other counseling recommendations.   Return in about 1 week (around 06/20/2022).  Future Appointments  Date Time Provider Department Center  06/17/2022  1:15 PM [facility]  06/20/2022 10:15 AM Levie Heritage, DO [facility] None  06/22/2022  6:30 AM [facility] SCHED ROOM [facility] None    Levie Heritage, DO

## 2022-06-13 NOTE — Progress Notes (Signed)
  History:  [name] is a 21 y.o. G2P0010 [redacted]w[redacted]d who presents to clinic today for ongoing prenatal care.    She is currently monitored for the following issues for this high-risk pregnancy and has ADHD (attention deficit hyperactivity disorder); Allergic rhinitis; Fatigue; Generalized headaches; Inattention; Child in care of non-parental family member; PTSD (post-traumatic stress disorder); Pain in thoracic spine; Scoliosis of thoracic spine; Stiffness of joint, pelvic region and thigh; Muscle weakness (generalized); Murmur; Vasovagal syncope; Irregular menses; Hair loss; BMI (body mass index), pediatric, less than 5th percentile for age; Dermatitis due to cat hair; Generalized abdominal pain; Gastroesophageal reflux disease without esophagitis; Supervision of other normal pregnancy, antepartum; Abnormal genetic test during pregnancy; Trisomy X syndrome; UTI (urinary tract infection); Pyelonephritis affecting pregnancy in second trimester; and Fetal growth restriction antepartum on their problem list.  Patient reports no complaints.  Contractions: Not present. Vag. Bleeding: None.  Movement: Present. Denies leaking of fluid    The following portions of the patient's history were reviewed and updated as appropriate: allergies, current medications, family history, past medical history, social history, past surgical history and problem list.  Review of Systems:  Review of Systems  Eyes:  Negative for blurred vision.  Respiratory:  Negative for shortness of breath.   Cardiovascular:  Negative for chest pain and palpitations.  Gastrointestinal:  Negative for abdominal pain.  Genitourinary:  Negative for dysuria.  Neurological:  Negative for dizziness.      Objective:  Physical Exam BP 113/76   Pulse (!) 101   Wt 134 lb (60.8 kg)   LMP 09/07/2021   BMI 19.79 kg/m  Physical Exam Constitutional:      General: She is not in acute distress.    Appearance: Normal appearance.   Cardiovascular:     Rate and Rhythm: Normal rate and regular rhythm.     Heart sounds: Normal heart sounds.  Pulmonary:     Effort: Pulmonary effort is normal.     Breath sounds: Normal breath sounds.  Abdominal:     Palpations: Abdomen is soft.     Tenderness: There is no abdominal tenderness. There is no guarding or rebound.  Skin:    General: Skin is warm.  Neurological:     General: No focal deficit present.     Mental Status: She is alert and oriented to person, place, and time.  Psychiatric:        Mood and Affect: Mood normal.       Labs and Imaging No results found for this or any previous visit (from the past 24 hour(s)).  No results found.  Health Maintenance Due  Topic Date Due   COVID-19 Vaccine (1) Never done   INFLUENZA VACCINE  02/12/2022    Labs, imaging and previous visits in Epic and Care Everywhere reviewed  Assessment & Plan:  Pregnancy: G2P0010 at [redacted]w[redacted]d 1. Supervision of other normal pregnancy, antepartum Scheduled f/u US and appointment for 41 wks Induction scheduled for 12/09  2. Trisomy X syndrome  Answered questions related to IOL, delivery timing, etc.  Advised L&D visitor policy.  Term labor symptoms and general obstetric precautions including but not limited to vaginal bleeding, contractions, leaking of fluid and fetal movement were reviewed in detail with the patient. Please refer to After Visit Summary for other counseling recommendations.   Barron Schmid, Medical Student 06/13/2022 2:06 PM

## 2022-06-17 ENCOUNTER — Other Ambulatory Visit: Payer: Self-pay

## 2022-06-17 ENCOUNTER — Telehealth (HOSPITAL_COMMUNITY): Payer: Self-pay | Admitting: *Deleted

## 2022-06-17 ENCOUNTER — Ambulatory Visit: Payer: Medicaid Other | Admitting: *Deleted

## 2022-06-17 ENCOUNTER — Encounter (HOSPITAL_COMMUNITY): Payer: Self-pay | Admitting: *Deleted

## 2022-06-17 ENCOUNTER — Ambulatory Visit (INDEPENDENT_AMBULATORY_CARE_PROVIDER_SITE_OTHER): Payer: Medicaid Other

## 2022-06-17 VITALS — BP 97/62 | HR 64 | Wt 134.0 lb

## 2022-06-17 DIAGNOSIS — Z3A40 40 weeks gestation of pregnancy: Secondary | ICD-10-CM

## 2022-06-17 DIAGNOSIS — O480 Post-term pregnancy: Secondary | ICD-10-CM

## 2022-06-17 NOTE — Telephone Encounter (Signed)
Preadmission screen  

## 2022-06-17 NOTE — Progress Notes (Signed)

## 2022-06-19 ENCOUNTER — Other Ambulatory Visit: Payer: Self-pay | Admitting: Advanced Practice Midwife

## 2022-06-20 ENCOUNTER — Other Ambulatory Visit: Payer: Medicaid Other

## 2022-06-20 ENCOUNTER — Ambulatory Visit (INDEPENDENT_AMBULATORY_CARE_PROVIDER_SITE_OTHER): Payer: Medicaid Other | Admitting: Family Medicine

## 2022-06-20 VITALS — BP 108/73 | HR 75 | Wt 134.0 lb

## 2022-06-20 DIAGNOSIS — Z3483 Encounter for supervision of other normal pregnancy, third trimester: Secondary | ICD-10-CM

## 2022-06-20 DIAGNOSIS — O365990 Maternal care for other known or suspected poor fetal growth, unspecified trimester, not applicable or unspecified: Secondary | ICD-10-CM

## 2022-06-20 DIAGNOSIS — O365930 Maternal care for other known or suspected poor fetal growth, third trimester, not applicable or unspecified: Secondary | ICD-10-CM

## 2022-06-20 DIAGNOSIS — Z3A40 40 weeks gestation of pregnancy: Secondary | ICD-10-CM

## 2022-06-20 DIAGNOSIS — Q970 Karyotype 47, XXX: Secondary | ICD-10-CM

## 2022-06-20 DIAGNOSIS — Z3480 Encounter for supervision of other normal pregnancy, unspecified trimester: Secondary | ICD-10-CM

## 2022-06-20 DIAGNOSIS — M419 Scoliosis, unspecified: Secondary | ICD-10-CM

## 2022-06-20 NOTE — Progress Notes (Signed)
   PRENATAL VISIT NOTE  Subjective:  Melissa Farley is a 21 y.o. G2P0010 at [redacted]w[redacted]d being seen today for ongoing prenatal care.  She is currently monitored for the following issues for this high-risk pregnancy and has ADHD (attention deficit hyperactivity disorder); Allergic rhinitis; Fatigue; Generalized headaches; Inattention; Child in care of non-parental family member; PTSD (post-traumatic stress disorder); Pain in thoracic spine; Scoliosis of thoracic spine; Stiffness of joint, pelvic region and thigh; Muscle weakness (generalized); Murmur; Vasovagal syncope; Irregular menses; Hair loss; BMI (body mass index), pediatric, less than 5th percentile for age; Dermatitis due to cat hair; Generalized abdominal pain; Gastroesophageal reflux disease without esophagitis; Supervision of other normal pregnancy, antepartum; Abnormal genetic test during pregnancy; Trisomy X syndrome; UTI (urinary tract infection); Pyelonephritis affecting pregnancy in second trimester; and Fetal growth restriction antepartum on their problem list.  Patient reports occasional contractions.  Contractions: Irregular. Vag. Bleeding: None.  Movement: Present. Denies leaking of fluid.   The following portions of the patient's history were reviewed and updated as appropriate: allergies, current medications, past family history, past medical history, past social history, past surgical history and problem list.   Objective:   Vitals:   06/20/22 1030  BP: 108/73  Pulse: 75  Weight: 134 lb (60.8 kg)    Fetal Status: Fetal Heart Rate (bpm): 130   Movement: Present     General:  Alert, oriented and cooperative. Patient is in no acute distress.  Skin: Skin is warm and dry. No rash noted.   Cardiovascular: Normal heart rate noted  Respiratory: Normal respiratory effort, no problems with respiration noted  Abdomen: Soft, gravid, appropriate for gestational age.  Pain/Pressure: Present (just pressure no pain)     Pelvic: Cervical  exam deferred        Extremities: Normal range of motion.  Edema: Trace  Mental Status: Normal mood and affect. Normal behavior. Normal judgment and thought content.   Assessment and Plan:  Pregnancy: G2P0010 at [redacted]w[redacted]d 1. [redacted] weeks gestation of pregnancy  2. Supervision of other normal pregnancy, antepartum FHT and FH normal Induction on Saturday  3. Trisomy X syndrome  4. Scoliosis of thoracic spine, unspecified scoliosis type Has some scoliosis of the lumbar spine. Xrays 2022 report as "mild"  5. Fetal growth restriction antepartum Resolved.   Term labor symptoms and general obstetric precautions including but not limited to vaginal bleeding, contractions, leaking of fluid and fetal movement were reviewed in detail with the patient. Please refer to After Visit Summary for other counseling recommendations.   No follow-ups on file.  Future Appointments  Date Time Provider Department Center  06/22/2022  6:30 AM [facility] SCHED ROOM [facility] None  07/24/2022  2:30 PM Levie Heritage, DO [facility] None    Levie Heritage, DO

## 2022-06-22 ENCOUNTER — Inpatient Hospital Stay (HOSPITAL_COMMUNITY): Payer: Medicaid Other

## 2022-06-23 ENCOUNTER — Inpatient Hospital Stay (HOSPITAL_COMMUNITY)
Admission: AD | Admit: 2022-06-23 | Discharge: 2022-06-26 | DRG: 807 | Disposition: A | Discharge status: Home / Self Care | Payer: Medicaid Other | Attending: Family Medicine | Admitting: Family Medicine

## 2022-06-23 ENCOUNTER — Other Ambulatory Visit: Payer: Self-pay

## 2022-06-23 ENCOUNTER — Inpatient Hospital Stay (HOSPITAL_COMMUNITY): Payer: Medicaid Other | Admitting: Anesthesiology

## 2022-06-23 ENCOUNTER — Encounter (HOSPITAL_COMMUNITY): Payer: Self-pay | Admitting: Family Medicine

## 2022-06-23 DIAGNOSIS — O9902 Anemia complicating childbirth: Secondary | ICD-10-CM | POA: Diagnosis not present

## 2022-06-23 DIAGNOSIS — D649 Anemia, unspecified: Secondary | ICD-10-CM | POA: Diagnosis not present

## 2022-06-23 DIAGNOSIS — Z880 Allergy status to penicillin: Secondary | ICD-10-CM | POA: Diagnosis not present

## 2022-06-23 DIAGNOSIS — Z8759 Personal history of other complications of pregnancy, childbirth and the puerperium: Secondary | ICD-10-CM

## 2022-06-23 DIAGNOSIS — Z3490 Encounter for supervision of normal pregnancy, unspecified, unspecified trimester: Secondary | ICD-10-CM | POA: Diagnosis present

## 2022-06-23 DIAGNOSIS — Z87891 Personal history of nicotine dependence: Secondary | ICD-10-CM | POA: Diagnosis not present

## 2022-06-23 DIAGNOSIS — O99893 Other specified diseases and conditions complicating puerperium: Secondary | ICD-10-CM | POA: Diagnosis not present

## 2022-06-23 DIAGNOSIS — I959 Hypotension, unspecified: Secondary | ICD-10-CM | POA: Diagnosis not present

## 2022-06-23 DIAGNOSIS — Z3A41 41 weeks gestation of pregnancy: Secondary | ICD-10-CM

## 2022-06-23 DIAGNOSIS — O480 Post-term pregnancy: Secondary | ICD-10-CM | POA: Diagnosis not present

## 2022-06-23 DIAGNOSIS — Z3480 Encounter for supervision of other normal pregnancy, unspecified trimester: Secondary | ICD-10-CM

## 2022-06-23 LAB — CBC
HCT: 29.7 % — ABNORMAL LOW (ref 36.0–46.0)
Hemoglobin: 9.3 g/dL — ABNORMAL LOW (ref 12.0–15.0)
MCH: 27.7 pg (ref 26.0–34.0)
MCHC: 31.3 g/dL (ref 30.0–36.0)
MCV: 88.4 fL (ref 80.0–100.0)
Platelets: 179 10*3/uL (ref 150–400)
RBC: 3.36 MIL/uL — ABNORMAL LOW (ref 3.87–5.11)
RDW: 13.6 % (ref 11.5–15.5)
WBC: 10.1 10*3/uL (ref 4.0–10.5)
nRBC: 0 % (ref 0.0–0.2)

## 2022-06-23 LAB — TYPE AND SCREEN
ABO/RH(D): A POS
Antibody Screen: NEGATIVE

## 2022-06-23 LAB — RPR: RPR Ser Ql: NONREACTIVE

## 2022-06-23 MED ORDER — FENTANYL-BUPIVACAINE-NACL 0.5-0.125-0.9 MG/250ML-% EP SOLN
12.0000 mL/h | EPIDURAL | Status: DC | PRN
  Filled 2022-06-23: qty 250

## 2022-06-23 MED ORDER — MISOPROSTOL 50MCG HALF TABLET
50.0000 ug | ORAL_TABLET | Freq: Once | ORAL | Status: DC
  Filled 2022-06-23: qty 1

## 2022-06-23 MED ORDER — EPHEDRINE 5 MG/ML INJ: 10.0000 mg | INTRAVENOUS | Status: DC | PRN

## 2022-06-23 MED ORDER — LIDOCAINE HCL (PF) 1 % IJ SOLN
30.0000 mL | INTRAMUSCULAR | Status: AC | PRN
  Administered 2022-06-24: 30 mL via SUBCUTANEOUS
  Filled 2022-06-23: qty 30

## 2022-06-23 MED ORDER — TERBUTALINE SULFATE 1 MG/ML IJ SOLN: 0.2500 mg | Freq: Once | INTRAMUSCULAR | Status: DC | PRN

## 2022-06-23 MED ORDER — OXYTOCIN-SODIUM CHLORIDE 30-0.9 UT/500ML-% IV SOLN
2.5000 [IU]/h | INTRAVENOUS | Status: DC
  Administered 2022-06-24: 2.5 [IU]/h via INTRAVENOUS
  Filled 2022-06-23 (×2): qty 500

## 2022-06-23 MED ORDER — LACTATED RINGERS IV SOLN: INTRAVENOUS | Status: DC

## 2022-06-23 MED ORDER — OXYTOCIN BOLUS FROM INFUSION
333.0000 mL | Freq: Once | INTRAVENOUS | Status: AC
  Administered 2022-06-24: 333 mL via INTRAVENOUS

## 2022-06-23 MED ORDER — FENTANYL-BUPIVACAINE-NACL 0.5-0.125-0.9 MG/250ML-% EP SOLN
EPIDURAL | Status: DC | PRN
  Administered 2022-06-23: 12 mL/h via EPIDURAL

## 2022-06-23 MED ORDER — PHENYLEPHRINE 80 MCG/ML (10ML) SYRINGE FOR IV PUSH (FOR BLOOD PRESSURE SUPPORT): 80.0000 ug | PREFILLED_SYRINGE | INTRAVENOUS | Status: DC | PRN

## 2022-06-23 MED ORDER — ONDANSETRON HCL 4 MG/2ML IJ SOLN
4.0000 mg | Freq: Four times a day (QID) | INTRAMUSCULAR | Status: DC | PRN
  Administered 2022-06-23: 4 mg via INTRAVENOUS
  Filled 2022-06-23: qty 2

## 2022-06-23 MED ORDER — DIPHENHYDRAMINE HCL 50 MG/ML IJ SOLN: 12.5000 mg | INTRAMUSCULAR | Status: DC | PRN

## 2022-06-23 MED ORDER — FENTANYL CITRATE (PF) 100 MCG/2ML IJ SOLN
50.0000 ug | INTRAMUSCULAR | Status: DC | PRN
  Administered 2022-06-23: 50 ug via INTRAVENOUS
  Administered 2022-06-23: 100 ug via INTRAVENOUS
  Filled 2022-06-23 (×2): qty 2

## 2022-06-23 MED ORDER — LACTATED RINGERS IV SOLN: 500.0000 mL | INTRAVENOUS | Status: DC | PRN

## 2022-06-23 MED ORDER — LIDOCAINE HCL (PF) 1 % IJ SOLN
INTRAMUSCULAR | Status: DC | PRN
  Administered 2022-06-23: 10 mL via EPIDURAL
  Administered 2022-06-23: 2 mL via EPIDURAL

## 2022-06-23 MED ORDER — LACTATED RINGERS IV SOLN
500.0000 mL | Freq: Once | INTRAVENOUS | Status: AC
  Administered 2022-06-23: 500 mL via INTRAVENOUS

## 2022-06-23 MED ORDER — MISOPROSTOL 50MCG HALF TABLET
50.0000 ug | ORAL_TABLET | Freq: Once | ORAL | Status: AC
  Administered 2022-06-23: 50 ug via ORAL
  Filled 2022-06-23: qty 1

## 2022-06-23 MED ORDER — MISOPROSTOL 25 MCG QUARTER TABLET
25.0000 ug | ORAL_TABLET | Freq: Once | ORAL | Status: AC
  Administered 2022-06-23: 25 ug via VAGINAL
  Filled 2022-06-23: qty 1

## 2022-06-23 MED ORDER — ACETAMINOPHEN 325 MG PO TABS: 650.0000 mg | ORAL_TABLET | ORAL | Status: DC | PRN

## 2022-06-23 MED ORDER — SOD CITRATE-CITRIC ACID 500-334 MG/5ML PO SOLN: 30.0000 mL | ORAL | Status: DC | PRN

## 2022-06-23 NOTE — Progress Notes (Signed)
Labor Progress Note Melissa Farley is a 21 y.o. G2P0010 at [redacted]w[redacted]d presented for IOL for PD. S: Patient is feeling contractions and pressure.  O:  BP 117/72   Pulse 65   Temp 98.3 F (36.8 C) (Oral)   Resp 18   Ht 5\' 9"  (1.753 m)   Wt 60.9 kg   LMP 09/07/2021   SpO2 100%   BMI 19.83 kg/m   CVE: Dilation: Lip/rim Effacement (%): 100 Station: 0 Presentation: Vertex Exam by:: Autry-LOtt MD   A&P: 21 y.o. G2P0010 [redacted]w[redacted]d here for IOL for PD. #Labor: Progressing well without need for pitocin. Expect delivery soon. #Pain: Epidural #FWB: Cat 1  [redacted]w[redacted]d, MD [name] 9:26 PM

## 2022-06-23 NOTE — Progress Notes (Signed)
LABOR PROGRESS NOTE  Melissa Farley is a 21 y.o. G2P0010 at [redacted]w[redacted]d  admitted for IOL for PD.   Subjective: Patient much more comfortable with epidural in place.  No current concerns.  Objective: BP 118/70   Pulse 71   Temp 98.3 F (36.8 C) (Oral)   Resp 18   Ht 5\' 9"  (1.753 m)   Wt 60.9 kg   LMP 09/07/2021   SpO2 100%   BMI 19.83 kg/m  or  Vitals:   06/23/22 1605 06/23/22 1610 06/23/22 1630 06/23/22 1700  BP: 104/70 123/75 113/72 118/70  Pulse: 67 73 70 71  Resp: 18 18 18 18   Temp:   98.3 F (36.8 C)   TempSrc:   Oral   SpO2: 99% 100% 100%   Weight:      Height:       Dilation: 5.5 Effacement (%): 80 Station: 0, -1 Presentation: Vertex Exam by:: Deklen Popelka, MD FHT: baseline rate 110, moderate varibility, +accels, no decel Toco: Every 2 to 3 minutes  Labs: Lab Results  Component Value Date   WBC 10.1 06/23/2022   HGB 9.3 (L) 06/23/2022   HCT 29.7 (L) 06/23/2022   MCV 88.4 06/23/2022   PLT 179 06/23/2022    Assessment / Plan: 21 y.o. G2P0010 at [redacted]w[redacted]d here for IOL for postdates  Labor: Progressing well.  Contracting occasionally at this point.  AROM at this check with large amount of moderately neck fluid.  Will monitor and consider starting Pitocin if contractions do not pick up. Fetal Wellbeing: Category 1 Pain Control: Epidural in place Anticipated MOD: Vaginal delivery   12, MD 06/23/2022, 5:10 PM

## 2022-06-23 NOTE — Progress Notes (Signed)
LABOR PROGRESS NOTE  Melissa Farley is a 21 y.o. G2P0010 at [redacted]w[redacted]d  admitted for IOL for PD.   Subjective: Currently doing well.  Resting.  Objective: BP 108/62   Pulse 71   Temp 98.3 F (36.8 C) (Oral)   Resp 18   Ht 5\' 9"  (1.753 m)   Wt 60.9 kg   LMP 09/07/2021   BMI 19.83 kg/m  or  Vitals:   06/23/22 0234 06/23/22 0843 06/23/22 0929 06/23/22 1315  BP:   (!) 102/49 108/62  Pulse:   66 71  Resp:   18 18  Temp:  98.5 F (36.9 C)  98.3 F (36.8 C)  TempSrc:  Oral  Oral  Weight: 60.9 kg     Height: 5\' 9"  (1.753 m)      Dilation: 4.5 Effacement (%): 80, 90 Station: 0, Plus 1 Presentation: Vertex Exam by:: , RN FHT: baseline rate 120, moderate varibility, +accels, no decel Toco: Every 1 to 2 minutes  Labs: Lab Results  Component Value Date   WBC 10.1 06/23/2022   HGB 9.3 (L) 06/23/2022   HCT 29.7 (L) 06/23/2022   MCV 88.4 06/23/2022   PLT 179 06/23/2022    Assessment / Plan: 21 y.o. G2P0010 at [redacted]w[redacted]d here for IOL for postdates  Labor: Progressing well.  Contracting frequently, will consider AROM/Pitocin if further augmentation needed later. Fetal Wellbeing: Category 1 Pain Control: Desires epidural, this was requested for patient Anticipated MOD: Vaginal delivery   12, MD 06/23/2022, 1:18 PM

## 2022-06-23 NOTE — Progress Notes (Signed)
LABOR PROGRESS NOTE  DIARRA CEJA is a 21 y.o. G2P0010 at [redacted]w[redacted]d  admitted for IOL for PD.   Subjective: Currently doing well.  Resting.  Objective: BP 110/68   Pulse 78   Temp 98.5 F (36.9 C) (Oral)   Resp 17   Ht 5\' 9"  (1.753 m)   Wt 60.9 kg   LMP 09/07/2021   BMI 19.83 kg/m  or  Vitals:   06/23/22 0225 06/23/22 0234 06/23/22 0843  BP: 110/68    Pulse: 78    Resp: 17    Temp: 98.4 F (36.9 C)  98.5 F (36.9 C)  TempSrc: Oral  Oral  Weight:  60.9 kg   Height:  5\' 9"  (1.753 m)    Dilation: 1 Effacement (%): 70, 80 Station: -2 Presentation: Vertex Exam by:: Chenae Brager, MD FHT: baseline rate 125, moderate varibility, +acel, nodecel Toco: Every 1 to 2 minutes  Labs: Lab Results  Component Value Date   WBC 10.1 06/23/2022   HGB 9.3 (L) 06/23/2022   HCT 29.7 (L) 06/23/2022   MCV 88.4 06/23/2022   PLT 179 06/23/2022    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   Abnormal genetic test during pregnancy 12/06/2021   Supervision of other normal pregnancy, antepartum 11/15/2021   Generalized abdominal pain 06/24/2019   Gastroesophageal reflux disease without esophagitis 06/24/2019   Dermatitis due to cat hair 05/21/2019   BMI (body mass index), pediatric, less than 5th percentile for age 68/29/2019   Hair loss 11/19/2017   Irregular menses 08/04/2017   Vasovagal syncope 03/07/2015   Pain in thoracic spine 04/19/2014   Scoliosis of thoracic spine 04/19/2014   Stiffness of joint, pelvic region and thigh 04/19/2014   Muscle weakness (generalized) 04/19/2014   PTSD (post-traumatic stress disorder) 08/03/2013   Fatigue 05/26/2013   Generalized headaches 05/26/2013   Inattention 05/26/2013   Child in care of  non-parental family member 05/26/2013   Murmur 03/25/2013   ADHD (attention deficit hyperactivity disorder) 11/09/2012   Allergic rhinitis 11/09/2012    Assessment / Plan: 21 y.o. G2P0010 at [redacted]w[redacted]d here for IOL for postdates  Labor: Progressing well.  S/p Cytotec 50/25.  Patient agreeable to Foley bulb placement at this check and it is placed.  Tachysystole at this time so we will hold off on additional Cytotec dose.  She has thinned up considerably since previous check.  Will monitor and reduce as able. Fetal Wellbeing: Category 1 Pain Control: IV pain medicines, epidural as needed Anticipated MOD: Vaginal delivery  12, MD  06/23/2022, 10:13 AM

## 2022-06-23 NOTE — Anesthesia Preprocedure Evaluation (Signed)
Anesthesia Evaluation  Patient identified by MRN, date of birth, ID band Patient awake    Reviewed: Allergy & Precautions, Patient's Chart, lab work & pertinent test results  Airway Mallampati: II  TM Distance: >3 FB Neck ROM: Full    Dental  (+) Dental Advisory Given   Pulmonary asthma , former smoker   Pulmonary exam normal breath sounds clear to auscultation       Cardiovascular Normal cardiovascular exam+ Valvular Problems/Murmurs (? no further history, has not been evaluated)  Rhythm:Regular Rate:Normal     Neuro/Psych  Headaches PSYCHIATRIC DISORDERS Anxiety     Triple X syndrome     GI/Hepatic Neg liver ROS,GERD  Controlled,,  Endo/Other  negative endocrine ROS    Renal/GU negative Renal ROS  negative genitourinary   Musculoskeletal Significant thoracolumbar scoliosis, has not qualified for surgery per pt   Abdominal   Peds negative pediatric ROS (+)  Hematology  (+) Blood dyscrasia, anemia Hb 9.3, plt 179   Anesthesia Other Findings   Reproductive/Obstetrics (+) Pregnancy                             Anesthesia Physical Anesthesia Plan  ASA: 3  Anesthesia Plan: Epidural   Post-op Pain Management:    Induction:   PONV Risk Score and Plan: 2  Airway Management Planned: Natural Airway  Additional Equipment: None  Intra-op Plan:   Post-operative Plan:   Informed Consent: I have reviewed the patients History and Physical, chart, labs and discussed the procedure including the risks, benefits and alternatives for the proposed anesthesia with the patient or authorized representative who has indicated his/her understanding and acceptance.       Plan Discussed with:   Anesthesia Plan Comments:        Anesthesia Quick Evaluation

## 2022-06-23 NOTE — H&P (Addendum)
OBSTETRIC ADMISSION HISTORY AND PHYSICAL  Melissa Farley is a 21 y.o. female G2P0010 with IUP at [redacted]w[redacted]d by LMP presenting for IOL for post dates. She reports +FMs, No LOF, no VB, no blurry vision, headaches or peripheral edema, and RUQ pain.  She plans on bottle feeding. She requests condoms for birth control. She received her prenatal care at [facility]  Dating: By LMP --->  Estimated Date of Delivery: 06/14/22  Sono:    @[redacted]w[redacted]d , CWD, normal anatomy, cephalic presentation,  2712g, 14% EFW  Prenatal History/Complications:  -Trisomy X syndrome -Pyelonephritis in pregnancy, resolved  Past Medical History: Past Medical History:  Diagnosis Date   Acute medial meniscus tear    ADHD (attention deficit hyperactivity disorder) 11/09/2012   Allergic rhinitis 11/09/2012   Allergy    Alopecia    Anxiety    Mom with severe anxiety, became addicted to pain meds   Asthma, mild intermittent 03/10/2016   Cardiac murmur 03/25/2013   Chronic kidney disease    Constipation    Fatigue    Frequent headaches    Poor eating habits    Scoliosis of thoracic spine 04/19/2014   UTI (urinary tract infection) 01/29/2022    Past Surgical History: Past Surgical History:  Procedure Laterality Date   NO PAST SURGERIES      Obstetrical History: OB History     Gravida  2   Para  0   Term  0   Preterm  0   AB  1   Living  0      SAB  1   IAB  0   Ectopic  0   Multiple  0   Live Births  0           Social History Social History   Socioeconomic History   Marital status: Single    Spouse name: Not on file   Number of children: Not on file   Years of education: Not on file   Highest education level: Not on file  Occupational History   Not on file  Tobacco Use   Smoking status: Former    Types: E-cigarettes   Smokeless tobacco: Never   Tobacco comments:    Mom stated this was a short time freshman year.  Vaping Use   Vaping Use: Former   Substances: Nicotine,  Flavoring  Substance and Sexual Activity   Alcohol use: No   Drug use: No   Sexual activity: Not Currently  Other Topics Concern   Not on file  Social History Narrative   Lives with grandparents since age 28-6, large extended family of cousins, aunts who are very supportive   Grandmother deceased       Intrauterine drug exposure      Father passed away in Nov 27, 2014          Grandmother passed away       Social Determinants of Health   Financial Resource Strain: Not on file  Food Insecurity: Not on file  Transportation Needs: Not on file  Physical Activity: Not on file  Stress: Not on file  Social Connections: Not on file    Family History: Family History  Problem Relation Age of Onset   Cancer Mother    Drug abuse Mother    Stroke Father    Asthma Father    Drug abuse Father    Asthma Sister    Behavior problems Sister    Asthma Maternal Aunt    Heart disease Maternal Uncle  HIV Paternal Uncle    Diabetes Maternal Grandfather    Hypertension Maternal Grandfather    Alcohol abuse Other    Birth defects Neg Hx     Allergies: Allergies  Allergen Reactions   Percocet [Oxycodone-Acetaminophen] Other (See Comments)    Vomited and had night mares   Penicillins Rash    Medications Prior to Admission  Medication Sig Dispense Refill Last Dose   ferrous sulfate 325 (65 FE) MG tablet Take 325 mg by mouth daily with breakfast.      Prenatal MV & Min w/FA-DHA (PRENATAL GUMMIES PO) Take by mouth.        Review of Systems   All systems reviewed and negative except as stated in HPI  Height 5\' 9"  (1.753 m), weight 60.9 kg, last menstrual period 09/07/2021. 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 Anatomy 11-15-1985: WNL, resolved choroid plexus cyst  Prenatal Transfer Tool  Maternal Diabetes: No Genetic Screening: Abnormal:  Results: Other: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   Abnormal genetic test during pregnancy 12/06/2021   Supervision of other normal pregnancy, antepartum 11/15/2021   Generalized abdominal pain 06/24/2019   Gastroesophageal reflux disease without esophagitis 06/24/2019   Dermatitis due to cat hair 05/21/2019   BMI (body mass index), pediatric, less than 5th percentile for age 21/29/2019   Hair loss 11/19/2017   Irregular menses 08/04/2017   Vasovagal syncope 03/07/2015   Pain in thoracic spine 04/19/2014   Scoliosis of thoracic spine 04/19/2014   Stiffness of joint, pelvic region and thigh 04/19/2014   Muscle weakness (generalized) 04/19/2014   PTSD (post-traumatic stress disorder) 08/03/2013   Fatigue 05/26/2013    Generalized headaches 05/26/2013   Inattention 05/26/2013   Child in care of non-parental family member 05/26/2013   Murmur 03/25/2013   ADHD (attention deficit hyperactivity disorder) 11/09/2012   Allergic rhinitis 11/09/2012    Assessment/Plan:  Melissa Farley is a 21 y.o. G2P0010 at [redacted]w[redacted]d here for IOL for post dates.  #Labor: Start with cytotec 50/25. Assess cervical change and need for FB before initiating pitocin. #Pain: Maternally supported for now, considering epidural #FWB: Cat 1 #ID:  GBS neg #MOF: Bottle #MOC: Condoms #Circ:  NA  [redacted]w[redacted]d, MD  Center for [facility], [facility] 06/23/2022, 2:47 AM  GME ATTESTATION:  I saw and evaluated the  patient. I agree with the findings and the plan of care as documented in the resident's note. I have made changes to documentation as necessary.  [name], DO OB Fellow, Faculty [facility], Center for [facility] 06/23/2022, 3:45 AM

## 2022-06-23 NOTE — Anesthesia Procedure Notes (Signed)
Epidural Patient location during procedure: OB Start time: 06/23/2022 3:28 PM End time: 06/23/2022 3:38 PM  Staffing Anesthesiologist: Lannie Fields, DO Performed: anesthesiologist   Preanesthetic Checklist Completed: patient identified, IV checked, risks and benefits discussed, monitors and equipment checked, pre-op evaluation and timeout performed  Epidural Patient position: sitting Prep: DuraPrep and site prepped and draped Patient monitoring: continuous pulse ox, blood pressure, heart rate and cardiac monitor Approach: midline Location: L3-L4 Injection technique: LOR air  Needle:  Needle type: Tuohy  Needle gauge: 17 G Needle length: 9 cm Needle insertion depth: 4.5 cm Catheter type: closed end flexible Catheter size: 19 Gauge Catheter at skin depth: 10 cm Test dose: negative  Assessment Sensory level: T8 Events: blood not aspirated, no cerebrospinal fluid, injection not painful, no injection resistance, no paresthesia and negative IV test  Additional Notes Patient identified. Risks/Benefits/Options discussed with patient including but not limited to bleeding, infection, nerve damage, paralysis, failed block, incomplete pain control, headache, blood pressure changes, nausea, vomiting, reactions to medication both or allergic, itching and postpartum back pain. Confirmed with bedside nurse the patient's most recent platelet count. Confirmed with patient that they are not currently taking any anticoagulation, have any bleeding history or any family history of bleeding disorders. Patient expressed understanding and wished to proceed. All questions were answered. Sterile technique was used throughout the entire procedure. Please see nursing notes for vital signs. Test dose was given through epidural catheter and negative prior to continuing to dose epidural or start infusion. Warning signs of high block given to the patient including shortness of breath, tingling/numbness in  hands, complete motor block, or any concerning symptoms with instructions to call for help. Patient was given instructions on fall risk and not to get out of bed. All questions and concerns addressed with instructions to call with any issues or inadequate analgesia.     Significant thoracolumbar scoliosis, however, fortunately I am able to visualize spinous processes Reason for block:procedure for pain

## 2022-06-24 ENCOUNTER — Encounter (HOSPITAL_COMMUNITY): Payer: Self-pay | Admitting: Family Medicine

## 2022-06-24 DIAGNOSIS — O480 Post-term pregnancy: Secondary | ICD-10-CM | POA: Diagnosis not present

## 2022-06-24 DIAGNOSIS — Z3A41 41 weeks gestation of pregnancy: Secondary | ICD-10-CM | POA: Diagnosis not present

## 2022-06-24 MED ORDER — SENNOSIDES-DOCUSATE SODIUM 8.6-50 MG PO TABS
2.0000 | ORAL_TABLET | Freq: Every day | ORAL | Status: DC
  Filled 2022-06-24: qty 2

## 2022-06-24 MED ORDER — TETANUS-DIPHTH-ACELL PERTUSSIS 5-2.5-18.5 LF-MCG/0.5 IM SUSY: 0.5000 mL | PREFILLED_SYRINGE | Freq: Once | INTRAMUSCULAR | Status: DC

## 2022-06-24 MED ORDER — IBUPROFEN 600 MG PO TABS
600.0000 mg | ORAL_TABLET | Freq: Four times a day (QID) | ORAL | Status: DC
  Filled 2022-06-24: qty 1

## 2022-06-24 MED ORDER — COCONUT OIL OIL: 1.0000 | TOPICAL_OIL | Status: DC | PRN

## 2022-06-24 MED ORDER — PRENATAL MULTIVITAMIN CH: 1.0000 | ORAL_TABLET | Freq: Every day | ORAL | Status: DC

## 2022-06-24 MED ORDER — COMPLETENATE 29-1 MG PO CHEW
1.0000 | CHEWABLE_TABLET | Freq: Every day | ORAL | Status: DC
  Filled 2022-06-24 (×2): qty 1

## 2022-06-24 MED ORDER — DIBUCAINE (PERIANAL) 1 % EX OINT: 1.0000 | TOPICAL_OINTMENT | CUTANEOUS | Status: DC | PRN

## 2022-06-24 MED ORDER — DIPHENHYDRAMINE HCL 25 MG PO CAPS: 25.0000 mg | ORAL_CAPSULE | Freq: Four times a day (QID) | ORAL | Status: DC | PRN

## 2022-06-24 MED ORDER — ACETAMINOPHEN 325 MG PO TABS: 650.0000 mg | ORAL_TABLET | ORAL | Status: DC | PRN

## 2022-06-24 MED ORDER — ONDANSETRON HCL 4 MG PO TABS: 4.0000 mg | ORAL_TABLET | ORAL | Status: DC | PRN

## 2022-06-24 MED ORDER — ONDANSETRON HCL 4 MG/2ML IJ SOLN: 4.0000 mg | INTRAMUSCULAR | Status: DC | PRN

## 2022-06-24 MED ORDER — ACETAMINOPHEN 160 MG/5ML PO SOLN
650.0000 mg | Freq: Four times a day (QID) | ORAL | Status: DC | PRN
  Administered 2022-06-24 – 2022-06-25 (×3): 650 mg via ORAL
  Filled 2022-06-24 (×3): qty 20.3

## 2022-06-24 MED ORDER — SIMETHICONE 80 MG PO CHEW: 80.0000 mg | CHEWABLE_TABLET | ORAL | Status: DC | PRN

## 2022-06-24 MED ORDER — WITCH HAZEL-GLYCERIN EX PADS
1.0000 | MEDICATED_PAD | CUTANEOUS | Status: DC | PRN
  Administered 2022-06-25: 1 via TOPICAL

## 2022-06-24 MED ORDER — BENZOCAINE-MENTHOL 20-0.5 % EX AERO
1.0000 | INHALATION_SPRAY | CUTANEOUS | Status: DC | PRN
  Administered 2022-06-25 – 2022-06-26 (×2): 1 via TOPICAL
  Filled 2022-06-24 (×2): qty 56

## 2022-06-24 MED ORDER — IBUPROFEN 100 MG/5ML PO SUSP
600.0000 mg | Freq: Four times a day (QID) | ORAL | Status: DC
  Administered 2022-06-24 – 2022-06-26 (×7): 600 mg via ORAL
  Filled 2022-06-24 (×8): qty 30

## 2022-06-24 NOTE — Discharge Summary (Signed)
Postpartum Discharge Summary  Date of Service updated12/13/23     Patient Name: Melissa Farley DOB: [birth year] MRN: [id number]  Date of admission: 06/23/2022 Delivery date:06/24/2022  Delivering provider: Gerlene Fee  Date of discharge: 06/26/2022  Admitting diagnosis: Encounter for induction of labor [Z34.90] Intrauterine pregnancy: [redacted]w[redacted]d    Secondary diagnosis:  Principal Problem:   Encounter for induction of labor Active Problems:   History of prior pregnancy with IUGR newborn  Additional problems: FGR (dx @ 34.5wk, EFW 11% w/ AC 5%, improved to EFW 14% and AC 14% at 37.4wk, however baby 315g@ birth, which is 8%)   Discharge diagnosis: Term Pregnancy Delivered                                              Post partum procedures: none Augmentation: AROM and Cytotec Complications: None  Hospital course: Induction of Labor With Vaginal Delivery   21y.o. yo G2P0010 at 482w3das admitted to the hospital 06/23/2022 for induction of labor.  Indication for induction: Postdates.  Patient had an uncomplicated labor course.  Membrane Rupture Time/Date: 4:58 PM ,06/23/2022   Delivery Method:Vaginal, Spontaneous  Episiotomy: None  Lacerations:  2nd degree;Perineal  Details of delivery can be found in separate delivery note.  Patient had a postpartum course complicated bynothing. Patient is discharged home 06/26/22.  Newborn Data: Birth date:06/24/2022  Birth time:12:33 AM  Gender:Female  Living status:Living  Apgars:9 ,9  Weight:3150 g 3150g  Magnesium Sulfate received: No BMZ received: No Rhophylac:No MMR:N/A T-DaP: offered prior to d/c Flu: offered prior to d/c Transfusion:No  Physical exam  Vitals:   06/25/22 1115 06/25/22 1241 06/25/22 2036 06/26/22 0500  BP: (!) 89/58 92/65 114/75 104/69  Pulse:  74 93 88  Resp:  _0 Temp:  98.3 F (36.8 C) 98.2 F (36.8 C) 97.9 F (36.6 C)  TempSrc:   Oral Oral  SpO2:  100% 98% 100%  Weight:       Height:       General: alert, cooperative, and no distress Lochia: appropriate Uterine Fundus: firm Incision: N/A DVT Evaluation: No evidence of DVT seen on physical exam. Negative Homan's sign. No cords or calf tenderness. No significant calf/ankle edema. Labs: Lab Results  Component Value Date   WBC 10.1 06/23/2022   HGB 9.3 (L) 06/23/2022   HCT 29.7 (L) 06/23/2022   MCV 88.4 06/23/2022   PLT 179 06/23/2022      Latest Ref Rng & Units 01/29/2022    4:11 AM  CMP  Glucose 70 - 99 mg/dL 131   BUN 6 - 20 mg/dL 8   Creatinine 0.44 - 1.00 mg/dL 0.55   Sodium 135 - 145 mmol/L 135   Potassium 3.5 - 5.1 mmol/L 3.7   Chloride 98 - 111 mmol/L 109   CO2 22 - 32 mmol/L 19   Calcium 8.9 - 10.3 mg/dL 8.8   Total Protein 6.5 - 8.1 g/dL 7.0   Total Bilirubin 0.3 - 1.2 mg/dL 0.5   Alkaline Phos 38 - 126 U/L 45   AST 15 - 41 U/L 19   ALT 0 - 44 U/L 10    Edinburgh Score:    06/24/2022    9:00 PM  Edinburgh Postnatal Depression Scale Screening Tool  I have been able to laugh and see the funny side of things.  0  I have looked forward with enjoyment to things. 0  I have blamed myself unnecessarily when things went wrong. 0  I have been anxious or worried for no good reason. 0  I have felt scared or panicky for no good reason. 0  Things have been getting on top of me. 0  I have been so unhappy that I have had difficulty sleeping. 0  I have felt sad or miserable. 0  I have been so unhappy that I have been crying. 0  The thought of harming myself has occurred to me. 0  Edinburgh Postnatal Depression Scale Total 0     After visit meds:  Allergies as of 06/26/2022       Reactions   Percocet [oxycodone-acetaminophen] Other (See Comments)   Vomited and had night mares   Penicillins Rash        Medication List     TAKE these medications    ferrous sulfate 325 (65 FE) MG tablet Take 1 tablet (325 mg total) by mouth every other day. What changed: when to take this    ibuprofen 100 MG/5ML suspension Commonly known as: ADVIL Take 30 mLs (600 mg total) by mouth every 6 (six) hours as needed for mild pain or moderate pain.   PRENATAL GUMMIES PO Take 2 tablets by mouth daily.         Discharge home in stable condition Infant Feeding: Bottle Infant Disposition:home with mother Discharge instruction: per After Visit Summary and Postpartum booklet. Activity: Advance as tolerated. Pelvic rest for 6 weeks.  Diet: routine diet Future Appointments: Future Appointments  Date Time Provider [place]  07/24/2022  2:30 PM Truett Mainland, DO [facility] None   Follow up Visit:  [place] Follow up.   Specialty: Obstetrics and Gynecology Why: as scheduled 1/10, for your postpartum visit Contact information: [place] [address] [phone]                Message sent to [facility] by [place] on 06/26/2022  Please schedule this patient for a In person postpartum visit in 6 weeks with the following provider: Any provider. Additional Postpartum F/U: 2nd degree perineal laceration   High risk pregnancy complicated by:  FGR, maternal trisomy X  Delivery mode:  Vaginal, Spontaneous  Anticipated Birth Control:  Condoms   06/26/2022 Roma Schanz, CNM

## 2022-06-24 NOTE — Anesthesia Postprocedure Evaluation (Signed)
Anesthesia Post Note  Patient: Melissa Farley  Procedure(s) Performed: AN AD HOC LABOR EPIDURAL     Patient location during evaluation: Mother Baby Anesthesia Type: Epidural Level of consciousness: awake, oriented and awake and alert Pain management: pain level controlled Vital Signs Assessment: post-procedure vital signs reviewed and stable Respiratory status: spontaneous breathing, respiratory function stable and nonlabored ventilation Cardiovascular status: stable Postop Assessment: no headache, adequate PO intake, able to ambulate, patient able to bend at knees and no apparent nausea or vomiting Anesthetic complications: no   No notable events documented.  Last Vitals:  Vitals:   06/24/22 0329 06/24/22 0449  BP: 108/63 (!) 100/59  Pulse: 66 65  Resp: 17 17  Temp: 36.8 C 36.7 C  SpO2: 95% 100%    Last Pain:  Vitals:   06/24/22 0745  TempSrc:   PainSc: 3    Pain Goal:                   Damin Salido

## 2022-06-25 DIAGNOSIS — Z8759 Personal history of other complications of pregnancy, childbirth and the puerperium: Secondary | ICD-10-CM

## 2022-06-25 MED ORDER — SENNOSIDES 8.8 MG/5ML PO SYRP
10.0000 mL | ORAL_SOLUTION | Freq: Every day | ORAL | Status: DC
  Filled 2022-06-25 (×2): qty 10

## 2022-06-25 MED ORDER — FERROUS SULFATE 325 (65 FE) MG PO TABS
325.0000 mg | ORAL_TABLET | Freq: Every day | ORAL | Status: DC
  Filled 2022-06-25: qty 1

## 2022-06-25 MED ORDER — DOCUSATE SODIUM 50 MG/5ML PO LIQD
100.0000 mg | Freq: Every day | ORAL | Status: DC
  Filled 2022-06-25 (×2): qty 10

## 2022-06-25 NOTE — Social Work (Signed)
MOB was referred for history of depression/anxiety.  * Referral screened out by Clinical Social Worker because none of the following criteria appear to apply:  ~ History of anxiety/depression during this pregnancy, or of post-partum depression following prior delivery.  ~ Diagnosis of anxiety and/or depression within last 3 years OR * MOB's symptoms currently being treated with medication and/or therapy.  Per chart preview MOB's diagnosis was prior to December 2020. Per OB records no noted symptoms during this pregnancy.   Please contact the Clinical Social Worker if needs arise or by MOB request.  Dick Hark, LCSWA Clinical Social Worker [phone] 

## 2022-06-25 NOTE — Progress Notes (Addendum)
POSTPARTUM PROGRESS NOTE  Subjective: Melissa Farley is a 21 y.o. G2P1011 s/p SVD at [redacted]w[redacted]d.  She reports she doing well. No acute events overnight. She denies any problems with ambulating, voiding or po intake. Denies nausea or vomiting. She has passed flatus. Pain is well controlled.  Lochia is appropriate.  Objective: Blood pressure (!) 82/50, pulse 74, temperature 97.9 F (36.6 C), temperature source Oral, resp. rate 16, height 5\' 9"  (1.753 m), weight 60.9 kg, last menstrual period 09/07/2021, SpO2 100 %, unknown if currently breastfeeding.  Physical Exam:  General: alert, cooperative and no distress Chest: no respiratory distress Abdomen: soft, non-tender  Uterine Fundus: firm and at level of umbilicus Extremities: No calf swelling or tenderness, no edema  Recent Labs    06/23/22 0210  HGB 9.3*  HCT 29.7*    Assessment/Plan: Melissa Farley is a 21 y.o. G2P1011 s/p SVD at [redacted]w[redacted]d for IOL PD.  Hypotension Asymptomatic. Continue to monitor.   Anemia Start oral iron  Routine Postpartum Care: Doing well, pain well-controlled.  -- Continue routine care, lactation support  -- Contraception: condoms -- Feeding: breast  Dispo: Plan for discharge tomorrow.  [redacted]w[redacted]d, MD Faculty Practice, Center for [facility]  GME ATTESTATION:  I saw and evaluated the patient. I agree with the findings and the plan of care as documented in the resident's note. I have made changes to documentation as necessary.  [employer], DO OB Fellow, Faculty [facility], Center for [facility] 06/25/2022, 8:28 AM  06/25/2022 7:22 AM

## 2022-06-25 NOTE — Discharge Instructions (Signed)
NO SEX UNTIL AFTER YOU GET YOUR BIRTH CONTROL  

## 2022-06-26 MED ORDER — FERROUS SULFATE 325 (65 FE) MG PO TABS: 325.0000 mg | ORAL_TABLET | ORAL | 0 refills | Status: DC

## 2022-06-26 MED ORDER — IBUPROFEN 100 MG/5ML PO SUSP: 600.0000 mg | Freq: Four times a day (QID) | ORAL | 0 refills | Status: DC | PRN

## 2022-07-02 ENCOUNTER — Telehealth (HOSPITAL_COMMUNITY): Payer: Self-pay

## 2022-07-02 NOTE — Telephone Encounter (Signed)
Patient did not answer phone call. Voicemail left for patient.   Marcelino Duster [facility] 07/02/22,1435

## 2022-07-24 ENCOUNTER — Encounter: Payer: Self-pay | Admitting: Family Medicine

## 2022-07-24 ENCOUNTER — Ambulatory Visit (INDEPENDENT_AMBULATORY_CARE_PROVIDER_SITE_OTHER): Payer: Medicaid Other | Admitting: Family Medicine

## 2022-07-24 NOTE — Progress Notes (Signed)
[place] Partum Visit Note  Melissa Farley is a 22 y.o. G46P1011 female who presents for a postpartum visit. She is weeks postpartum following a normal spontaneous vaginal delivery.  I have fully reviewed the prenatal and intrapartum course. The delivery was at 41.3 gestational weeks.  Anesthesia: epidural. Postpartum course has been uneventful. Baby is doing well. Baby is feeding by bottle -   . Bleeding no bleeding. Bowel function is normal. Bladder function is normal. Patient is not sexually active. Contraception method is condoms. Postpartum depression screening: negative. Score 4  The pregnancy intention screening data noted above was reviewed. Potential methods of contraception were discussed. The patient elected to proceed with No data recorded.   Edinburgh Postnatal Depression Scale - 07/24/22 1444       Edinburgh Postnatal Depression Scale:  In the Past 7 Days   I have been able to laugh and see the funny side of things. 0    I have looked forward with enjoyment to things. 0    I have blamed myself unnecessarily when things went wrong. 0    I have been anxious or worried for no good reason. 1    I have felt scared or panicky for no good reason. 3    Things have been getting on top of me. 0    I have been so unhappy that I have had difficulty sleeping. 0    I have felt sad or miserable. 0    I have been so unhappy that I have been crying. 0    The thought of harming myself has occurred to me. 0    Edinburgh Postnatal Depression Scale Total 4             Health Maintenance Due  Topic Date Due   COVID-19 Vaccine (1) Never done   INFLUENZA VACCINE  02/12/2022    The following portions of the patient's history were reviewed and updated as appropriate: allergies, current medications, past family history, past medical history, past social history, past surgical history, and problem list.  Review of Systems Pertinent items are noted in HPI.  Objective:  BP 110/80    Pulse (!) 112   Wt 111 lb (50.3 kg)   LMP 09/07/2021   Breastfeeding No   BMI 16.39 kg/m    General:  alert, cooperative, and no distress   Breasts:  not indicated  Lungs: clear to auscultation bilaterally  Heart:  regular rate and rhythm, S1, S2 normal, no murmur, click, rub or gallop  Abdomen: soft, non-tender; bowel sounds normal; no masses,  no organomegaly   Wound N/a  GU exam:  not indicated       Assessment:   1. Postpartum exam      Plan:   Essential components of care per ACOG recommendations:  1.  Mood and well being: Patient with negative depression screening today. Reviewed local resources for support.  - Patient tobacco use? No.   - hx of drug use? No.    2. Infant care and feeding:  -Patient currently breastmilk feeding? No.  -Social determinants of health (SDOH) reviewed in EPIC. No concerns  3. Sexuality, contraception and birth spacing - Patient does not want a pregnancy in the next year.  - Reviewed reproductive life planning. Reviewed contraceptive methods based on pt preferences and effectiveness.  Patient desired Female Condom today.   - Discussed birth spacing of 18 months  4. Sleep and fatigue -Encouraged family/partner/community support of 4 hrs of uninterrupted  sleep to help with mood and fatigue  5. Physical Recovery  - Discussed patients delivery and complications. She describes her labor as good. - Patient had a Vaginal, no problems at delivery. Patient had a 2nd degree laceration. Perineal healing reviewed. Patient expressed understanding - Patient has urinary incontinence? No. - Patient is safe to resume physical and sexual activity  6.  Health Maintenance - HM due items addressed Yes - Last pap smear No results found for: "DIAGPAP" Pap smear not done  7. Chronic Disease/Pregnancy Condition follow up: None  - PCP follow up  [place] for [place]

## 2022-10-16 ENCOUNTER — Other Ambulatory Visit: Payer: Medicaid Other | Admitting: *Deleted

## 2022-10-16 NOTE — Patient Outreach (Signed)
  Medicaid Managed Care   Unsuccessful Attempt Note   10/16/2022 Name: Melissa Farley MRN: [id number] DOB: [birth year]  Referred by: Pcp, No Reason for referral : High Risk Managed Medicaid (Unsuccessful RNCM initial outreach)   An unsuccessful telephone outreach was attempted today. The patient was referred to the case management team for assistance with care management and care coordination.    Follow Up Plan: A HIPAA compliant phone message was left for the patient providing contact information and requesting a return call. and The Managed Medicaid care management team will reach out to the patient again over the next 7 days.    Lurena Joiner RN, BSN [facility] RN Care Coordinator [phone]

## 2022-10-16 NOTE — Patient Instructions (Signed)
Visit Information  [name]  - as a part of your Medicaid benefit, you are eligible for care management and care coordination services at no cost or copay. I was unable to reach you by phone today but would be happy to help you with your health related needs. Please feel free to call me @ [phone].   A member of the Managed Medicaid care management team will reach out to you again over the next 7 days.   Lurena Joiner RN, BSN [place] [facility] RN Care Coordinator [phone]

## 2022-10-23 ENCOUNTER — Telehealth: Payer: Self-pay

## 2022-10-23 NOTE — Telephone Encounter (Signed)
..   Medicaid Managed Care   Unsuccessful Outreach Note  10/23/2022 Name: Melissa Farley MRN: [id number] DOB: 2000/11/23  Referred by: Pcp, No Reason for referral : Appointment   A second unsuccessful telephone outreach was attempted today. The patient was referred to the case management team for assistance with care management and care coordination.   Follow Up Plan: The care management team will reach out to the patient again over the next 7 days.   SIGNATURE

## 2022-11-01 ENCOUNTER — Telehealth: Payer: Self-pay

## 2022-11-01 NOTE — Progress Notes (Signed)
..   Medicaid Managed Care   Unsuccessful Outreach Note  11/01/2022 Name: Melissa Farley MRN: [id number] DOB: 2001-01-13  Referred by: Pcp, No Reason for referral : Appointment   Third unsuccessful telephone outreach was attempted today. The patient was referred to the case management team for assistance with care management and care coordination. The patient's primary care provider has been notified of our unsuccessful attempts to make or maintain contact with the patient. The care management team is pleased to engage with this patient at any time in the future should he/she be interested in assistance from the care management team.   Follow Up Plan: We have been unable to make contact with the patient for follow up. The care management team is available to follow up with the patient after provider conversation with the patient regarding recommendation for care management engagement and subsequent re-referral to the care management team.   Weston Settle Care Guide  [facility] Managed  Care Guide [facility]  [phone]

## 2023-01-13 ENCOUNTER — Ambulatory Visit (INDEPENDENT_AMBULATORY_CARE_PROVIDER_SITE_OTHER): Payer: Medicaid Other | Admitting: Obstetrics and Gynecology

## 2023-01-13 ENCOUNTER — Encounter: Payer: Self-pay | Admitting: Obstetrics and Gynecology

## 2023-01-13 VITALS — BP 92/57 | HR 99 | Wt 101.0 lb

## 2023-01-13 DIAGNOSIS — Z30011 Encounter for initial prescription of contraceptive pills: Secondary | ICD-10-CM | POA: Diagnosis not present

## 2023-01-13 LAB — POCT URINE PREGNANCY: Preg Test, Ur: NEGATIVE

## 2023-01-13 MED ORDER — JUNEL FE 24 1-20 MG-MCG(24) PO TABS: 1.0000 | ORAL_TABLET | Freq: Every day | ORAL | 11 refills | Status: DC

## 2023-01-13 NOTE — Progress Notes (Signed)
   GYNECOLOGY OFFICE VISIT NOTE  History:   Melissa Farley is a 22 y.o. G2P1011 here today for birth control. She thinks she would like to start OCPs. She has been using condoms since her delivery.  She is bottlefeeding. She mostly would like OCPs to make her periods lighter or no period. She used nexplanon in the past but lost weight. Did Depo and made her sick. Used OCPs in the past and did well on them.      Past Medical History:  Diagnosis Date   Acute medial meniscus tear    ADHD (attention deficit hyperactivity disorder) 11/09/2012   Allergic rhinitis 11/09/2012   Allergy    Alopecia    Anxiety    Mom with severe anxiety, became addicted to pain meds   Asthma, mild intermittent 03/10/2016   Cardiac murmur 03/25/2013   Chronic kidney disease    Constipation    Fatigue    Frequent headaches    Poor eating habits    Scoliosis of thoracic spine 04/19/2014   UTI (urinary tract infection) 01/29/2022    Past Surgical History:  Procedure Laterality Date   NO PAST SURGERIES      The following portions of the patient's history were reviewed and updated as appropriate: allergies, current medications, past family history, past medical history, past social history, past surgical history and problem list.   Review of Systems:  Pertinent items noted in HPI and remainder of comprehensive ROS otherwise negative.  Physical Exam:  BP (!) 92/57   Pulse 99   Wt 101 lb (45.8 kg)   LMP 01/02/2023 (Exact Date)   Breastfeeding No   BMI 14.92 kg/m  CONSTITUTIONAL: Well-developed, well-nourished female in no acute distress.  HEENT:  Normocephalic, atraumatic. External right and left ear normal. No scleral icterus.  NECK: Normal range of motion, supple, no masses noted on observation SKIN: No rash noted. Not diaphoretic. No erythema. No pallor. MUSCULOSKELETAL: Normal range of motion. No edema noted. NEUROLOGIC: Alert and oriented to person, place, and time. Normal muscle tone  coordination. No cranial nerve deficit noted. PSYCHIATRIC: Normal mood and affect. Normal behavior. Normal judgment and thought content.  PELVIC: Deferred  Assessment and Plan:   1. OCP (oral contraceptive pills) initiation - Reviewed different types of birth control available: OCPs, vaginal ring, Nexplanon, Depo, various types of IUDs, permanent sterilization.  We reviewed the advantages and risks of each (particularly risk of VTE with estrogen containing options). We discussed side effects of each. - Reviewed that birth control does not protect against STI. Condoms reduce the risk of transmission but are not 100% effective especially for HSV and HIV. - Declines STI testing today.  - Patient has tried: Condoms - Patient desires: OCPs -     POCT urine pregnancy -     Norethindrone Acetate-Ethinyl Estrad-FE (JUNEL FE 24) 1-20 MG-MCG(24) tablet; Take 1 tablet by mouth daily.    Routine preventative health maintenance measures emphasized. Please refer to After Visit Summary for other counseling recommendations.   Return in about 14 months (around 03/15/2024) for annual.  Milas Hock, MD, FACOG Obstetrician & Gynecologist, [facility], [facility]

## 2023-01-20 ENCOUNTER — Ambulatory Visit: Payer: Medicaid Other | Admitting: Obstetrics & Gynecology

## 2023-02-21 ENCOUNTER — Telehealth: Payer: Self-pay

## 2023-02-21 NOTE — Telephone Encounter (Signed)
Patient had left message with the after hours line that she needed to make an appointment.   I left a voicemail for the patient to call back and schedule her appointment.

## 2023-02-24 DIAGNOSIS — U071 COVID-19: Secondary | ICD-10-CM | POA: Diagnosis not present

## 2023-02-24 DIAGNOSIS — R0981 Nasal congestion: Secondary | ICD-10-CM | POA: Diagnosis not present

## 2023-02-24 DIAGNOSIS — Z681 Body mass index (BMI) 19 or less, adult: Secondary | ICD-10-CM | POA: Diagnosis not present

## 2023-03-18 ENCOUNTER — Other Ambulatory Visit: Payer: Self-pay

## 2023-03-18 DIAGNOSIS — Z30011 Encounter for initial prescription of contraceptive pills: Secondary | ICD-10-CM

## 2023-03-18 MED ORDER — JUNEL FE 24 1-20 MG-MCG(24) PO TABS: 1.0000 | ORAL_TABLET | Freq: Every day | ORAL | 11 refills | Status: DC

## 2023-03-19 ENCOUNTER — Ambulatory Visit (INDEPENDENT_AMBULATORY_CARE_PROVIDER_SITE_OTHER): Payer: Medicaid Other | Admitting: Obstetrics and Gynecology

## 2023-03-19 ENCOUNTER — Encounter: Payer: Self-pay | Admitting: Obstetrics and Gynecology

## 2023-03-19 VITALS — BP 111/79 | HR 91 | Ht 69.0 in | Wt 103.0 lb

## 2023-03-19 DIAGNOSIS — Z30013 Encounter for initial prescription of injectable contraceptive: Secondary | ICD-10-CM | POA: Diagnosis not present

## 2023-03-19 DIAGNOSIS — Z3202 Encounter for pregnancy test, result negative: Secondary | ICD-10-CM

## 2023-03-19 DIAGNOSIS — Z3009 Encounter for other general counseling and advice on contraception: Secondary | ICD-10-CM

## 2023-03-19 MED ORDER — MEDROXYPROGESTERONE ACETATE 150 MG/ML IM SUSY
150.0000 mg | PREFILLED_SYRINGE | Freq: Once | INTRAMUSCULAR | Status: AC
  Administered 2023-03-19: 150 mg via INTRAMUSCULAR

## 2023-03-19 MED ORDER — MEDROXYPROGESTERONE ACETATE 150 MG/ML IM SUSY: 150.0000 mg | PREFILLED_SYRINGE | INTRAMUSCULAR | 4 refills | Status: DC

## 2023-03-19 NOTE — Progress Notes (Signed)
GYN VISIT Patient name: Melissa Farley MRN [id number]  Date of birth: 07/10/2001 Chief Complaint:   Contraception ("I'm on the pill now and would like the shot.")  History of Present Illness:   Melissa Farley is a 22 y.o. G63P1011  female being seen today for  Birth control. Is currently on OCP 2 1/2-3 months. Is forgetting to take the pill. Has had depo before and did well.  Started cycle yesterday. No gyn complaints Patient's last menstrual period was 03/18/2023 (exact date). The current method of family planning is OCP (estrogen/progesterone).  Last pap n/a <21     05/23/2022    2:05 PM 03/14/2022    8:57 AM 11/15/2021   11:35 AM 04/04/2017    2:22 PM  Depression screen PHQ 2/9  Decreased Interest 0 0 1 0  Down, Depressed, Hopeless 0 0 0 0  PHQ - 2 Score 0 0 1 0  Altered sleeping 1 2 3 1   Tired, decreased energy 2 1 1 1   Change in appetite 0 0 0 0  Feeling bad or failure about yourself  0 0 0 0  Trouble concentrating 0 0 0 3  Moving slowly or fidgety/restless 0 0 0 0  Suicidal thoughts 0 0    PHQ-9 Score 3 3 5 5         05/23/2022    2:05 PM 03/14/2022    8:58 AM 11/15/2021   11:36 AM 04/04/2017    2:22 PM  GAD 7 : Generalized Anxiety Score  Nervous, Anxious, on Edge 1 0 0 0  Control/stop worrying 0 0 0 1  Worry too much - different things 1 1 0 1  Trouble relaxing 0 0 1 1  Restless 0 0 0 0  Easily annoyed or irritable 2 2 0 2  Afraid - awful might happen 0 0 0 2  Total GAD 7 Score 4 3 1 7      Review of Systems:   Pertinent items are noted in HPI Denies fever/chills, dizziness, headaches, visual disturbances, fatigue, shortness of breath, chest pain, abdominal pain, vomiting, abnormal vaginal discharge/itching/odor/irritation, problems with periods, bowel movements, urination, or intercourse unless otherwise stated above.  Pertinent History Reviewed:  Reviewed past medical,surgical, social, obstetrical and family history.  Reviewed problem list, medications and  allergies. Physical Assessment:   Vitals:   03/19/23 1621  BP: 111/79  Pulse: 91  Weight: 103 lb (46.7 kg)  Height: 5\' 9"  (1.753 m)  Body mass index is 15.21 kg/m.       Physical Examination:   General appearance: alert, well appearing, and in no distress  Mental status: alert, oriented to person, place, and time  Skin: warm & dry   Cardiovascular: normal heart rate noted  Respiratory: normal respiratory effort, no distress  Abdomen: soft, non-tender   Pelvic: deferred   Extremities: no edema   Chaperone:  No results found for this or any previous visit (from the past 24 hour(s)).  Assessment & Plan:  1. Negative pregnancy test  - POCT urine pregnancy  2. Encounter for counseling regarding contraception 3. Encounter for prescription for depo-Provera Counseled on options, would like depo provera. Discussed initiation today, injection every three months, side effects, bleeding profile and back up method   - medroxyPROGESTERone Acetate 150 MG/ML SUSY; Inject 1 mL (150 mg total) into the muscle every 3 (three) months. Bring with you to injection appointment  Dispense: 1 mL; Refill: 4    Meds:  Meds ordered this encounter  Medications   medroxyPROGESTERone Acetate 150 MG/ML SUSY    Sig: Inject 1 mL (150 mg total) into the muscle every 3 (three) months. Bring with you to injection appointment    Dispense:  1 mL    Refill:  4    Orders Placed This Encounter  Procedures   POCT urine pregnancy   Return in 3 months for depo shot Future Appointments  Date Time Provider Department Center  06/11/2023  3:30 PM [facility] NURSE [facility]    Albertine Grates, FNP

## 2023-03-27 ENCOUNTER — Encounter: Payer: Self-pay | Admitting: *Deleted

## 2023-04-08 ENCOUNTER — Other Ambulatory Visit: Payer: Self-pay | Admitting: *Deleted

## 2023-04-08 DIAGNOSIS — Z30013 Encounter for initial prescription of injectable contraceptive: Secondary | ICD-10-CM

## 2023-04-08 MED ORDER — MEDROXYPROGESTERONE ACETATE 150 MG/ML IM SUSY: 150.0000 mg | PREFILLED_SYRINGE | INTRAMUSCULAR | 4 refills | Status: DC

## 2023-06-11 ENCOUNTER — Ambulatory Visit: Payer: Medicaid Other

## 2023-06-29 ENCOUNTER — Ambulatory Visit
Admission: EM | Admit: 2023-06-29 | Discharge: 2023-06-29 | Discharge status: Against Medical Advice | Payer: Medicaid Other

## 2023-07-11 ENCOUNTER — Telehealth: Payer: Self-pay | Admitting: *Deleted

## 2023-07-11 ENCOUNTER — Encounter: Payer: Self-pay | Admitting: Physician Assistant

## 2023-07-11 ENCOUNTER — Ambulatory Visit
Admission: EM | Admit: 2023-07-11 | Discharge: 2023-07-11 | Disposition: A | Discharge status: Home / Self Care | Payer: Medicaid Other

## 2023-07-11 ENCOUNTER — Ambulatory Visit: Payer: Medicaid Other

## 2023-07-11 ENCOUNTER — Other Ambulatory Visit: Payer: Self-pay

## 2023-07-11 DIAGNOSIS — Z87891 Personal history of nicotine dependence: Secondary | ICD-10-CM | POA: Insufficient documentation

## 2023-07-11 DIAGNOSIS — N939 Abnormal uterine and vaginal bleeding, unspecified: Secondary | ICD-10-CM | POA: Insufficient documentation

## 2023-07-11 DIAGNOSIS — Z793 Long term (current) use of hormonal contraceptives: Secondary | ICD-10-CM | POA: Diagnosis not present

## 2023-07-11 DIAGNOSIS — R059 Cough, unspecified: Secondary | ICD-10-CM | POA: Diagnosis present

## 2023-07-11 DIAGNOSIS — R0981 Nasal congestion: Secondary | ICD-10-CM | POA: Diagnosis not present

## 2023-07-11 DIAGNOSIS — J069 Acute upper respiratory infection, unspecified: Secondary | ICD-10-CM | POA: Insufficient documentation

## 2023-07-11 DIAGNOSIS — Z3202 Encounter for pregnancy test, result negative: Secondary | ICD-10-CM | POA: Insufficient documentation

## 2023-07-11 LAB — POCT INFLUENZA A/B
Influenza A, POC: NEGATIVE
Influenza B, POC: NEGATIVE

## 2023-07-11 LAB — POCT URINE PREGNANCY: Preg Test, Ur: NEGATIVE

## 2023-07-11 MED ORDER — IPRATROPIUM BROMIDE 0.03 % NA SOLN: 2.0000 | Freq: Two times a day (BID) | NASAL | 0 refills | Status: DC

## 2023-07-11 MED ORDER — NORETHINDRONE ACETATE 5 MG PO TABS: 10.0000 mg | ORAL_TABLET | Freq: Every day | ORAL | 0 refills | Status: DC

## 2023-07-11 MED ORDER — PROMETHAZINE-DM 6.25-15 MG/5ML PO SYRP: 5.0000 mL | ORAL_SOLUTION | Freq: Every evening | ORAL | 0 refills | Status: DC | PRN

## 2023-07-11 NOTE — ED Provider Notes (Signed)
[facility]    CSN: [id number] Arrival date & time: 07/11/23  1521      History   Chief Complaint Chief Complaint  Patient presents with   Cough    HPI Melissa Farley is a 22 y.o. female.   Patient presents today with several concerns.  Her primary concern today is a 10-day history of menorrhagia having to change her personal hygiene products every few hours.  She reports that she had been on Depo-Provera for approximately a year but then took her last dose August 2024.  She switched to OCP formulation and took this for a month but then has had very heavy and long menstrual cycles.  Before this past month her menstrual cycles were every month and lasted for no more than 5 days.  She denies any additional medication changes.  Denies personal or family history of fibroids.  She does have an OB/GYN but was unable to schedule an appoint with them until the end of July.  She denies any chest pain, shortness of breath, palpitations, lightheadedness, weakness.  In addition, she reports a 2-day history of URI symptoms including sore throat, cough, congestion, otalgia.  She denies any chest pain, shortness of breath, fever, nausea, vomiting.  Denies any known sick contacts.  She has tried ibuprofen without improvement of symptoms.  Denies any recent antibiotics or steroids.  She does report a history of asthma when she was younger as well as allergies but has not been taking medication for this regularly recently.  Denies hospitalization for asthma.    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   Gastroesophageal reflux disease without esophagitis 06/24/2019   Dermatitis due to cat hair 05/21/2019   BMI (body mass index), pediatric, less than 5th percentile for age 62/29/2019   Vasovagal syncope 03/07/2015   Scoliosis of thoracic spine 04/19/2014   PTSD (post-traumatic stress disorder) 08/03/2013   Murmur 03/25/2013   ADHD (attention deficit hyperactivity disorder) 11/09/2012   Allergic rhinitis 11/09/2012    Past Surgical History:  Procedure Laterality Date   NO PAST SURGERIES      OB History     Gravida  2   Para  1   Term  1   Preterm  0   AB  1   Living  1      SAB  1   IAB  0   Ectopic  0   Multiple  0   Live Births  1            Home Medications    Prior to Admission medications   Medication Sig Start Date End Date Taking? Authorizing Provider  AUROVELA 24 FE 1-20 MG-MCG(24) tablet Take 1 tablet by mouth daily. 07/05/23  Yes [provider]  ipratropium (ATROVENT) 0.03 % nasal spray Place 2 sprays into both nostrils every  12 (twelve) hours. 07/11/23   Tamma Brigandi, Noberto Retort, PA-C  norethindrone (AYGESTIN) 5 MG tablet Take 2 tablets (10 mg total) by mouth daily. 07/11/23   Jazmyn Offner, Noberto Retort, PA-C  promethazine-dextromethorphan (PROMETHAZINE-DM) 6.25-15 MG/5ML syrup Take 5 mLs by mouth at bedtime as needed for cough. 07/11/23   Digna Countess, Noberto Retort, PA-C    Family History Family History  Problem Relation Age of Onset   Miscarriages / Stillbirths Mother    Cancer Mother    Drug abuse Mother    Stroke Father    Asthma Father    Drug abuse Father    Asthma Sister    Behavior problems Sister    Asthma Maternal Aunt    Heart disease Maternal Uncle    HIV Paternal Uncle    Diabetes Maternal Grandfather    Hypertension Maternal Grandfather    Alcohol abuse Other    Birth  defects Neg Hx     Social History Social History   Tobacco Use   Smoking status: Former    Types: E-cigarettes   Smokeless tobacco: Never   Tobacco comments:    Mom stated this was a short time freshman year.  Vaping Use   Vaping status: Former   Quit date: 06/14/2021   Substances: Nicotine, Flavoring  Substance Use Topics   Alcohol use: No   Drug use: No     Allergies   Percocet [oxycodone-acetaminophen] and Penicillins   Review of Systems Review of Systems  Constitutional:  Positive for activity change. Negative for appetite change, fatigue and fever.  HENT:  Positive for congestion and sore throat. Negative for sinus pressure and sneezing.   Respiratory:  Positive for cough. Negative for shortness of breath.   Cardiovascular:  Negative for chest pain.  Gastrointestinal:  Negative for abdominal pain, diarrhea, nausea and vomiting.  Genitourinary:  Positive for menstrual problem and vaginal bleeding. Negative for vaginal discharge and vaginal pain.  Musculoskeletal:  Negative for arthralgias and myalgias.  Neurological:  Negative for dizziness, light-headedness and headaches.     Physical Exam Triage Vital Signs ED Triage Vitals  Encounter Vitals Group     BP 07/11/23 1805 113/66     Systolic BP Percentile --      Diastolic BP Percentile --      Pulse Rate 07/11/23 1805 86     Resp 07/11/23 1805 16     Temp 07/11/23 1805 98.6 F (37 C)     Temp Source 07/11/23 1805 Oral     SpO2 07/11/23 1805 97 %     Weight --      Height --      Head Circumference --      Peak Flow --      Pain Score 07/11/23 1803 2     Pain Loc --      Pain Education --      Exclude from Growth Chart --    No data found.  Updated Vital Signs BP 113/66 (BP Location: Left Arm)   Pulse 86   Temp 98.6 F (37 C) (Oral)   Resp 16   LMP 07/01/2023   SpO2 97%   Breastfeeding No   Visual Acuity Right Eye Distance:   Left Eye Distance:   Bilateral Distance:    Right Eye Near:    Left Eye Near:    Bilateral Near:     Physical Exam Vitals reviewed.  Constitutional:      General: She is awake. She is not in acute distress.  Appearance: Normal appearance. She is well-developed. She is not ill-appearing.     Comments: Very pleasant female appears stated age in no acute distress sitting comfortably in exam room  HENT:     Head: Normocephalic and atraumatic.     Right Ear: Ear canal and external ear normal. A middle ear effusion is present. Tympanic membrane is not erythematous or bulging.     Left Ear: Tympanic membrane, ear canal and external ear normal. Tympanic membrane is not erythematous or bulging.     Nose:     Right Sinus: No maxillary sinus tenderness or frontal sinus tenderness.     Left Sinus: No maxillary sinus tenderness or frontal sinus tenderness.     Mouth/Throat:     Pharynx: Uvula midline. Postnasal drip present. No oropharyngeal exudate or posterior oropharyngeal erythema.  Cardiovascular:     Rate and Rhythm: Normal rate and regular rhythm.     Heart sounds: Normal heart sounds, S1 normal and S2 normal. No murmur heard. Pulmonary:     Effort: Pulmonary effort is normal.     Breath sounds: Normal breath sounds. No wheezing, rhonchi or rales.     Comments: Clear to auscultation bilaterally Psychiatric:        Behavior: Behavior is cooperative.      UC Treatments / Results  Labs (all labs ordered are listed, but only abnormal results are displayed) Labs Reviewed  POCT URINE PREGNANCY - Normal  SARS CORONAVIRUS 2 (TAT 6-24 HRS)  CBC WITH DIFFERENTIAL/PLATELET  COMPREHENSIVE METABOLIC PANEL  TSH  POCT INFLUENZA A/B    EKG   Radiology No results found.  Procedures Procedures (including critical care time)  Medications Ordered in UC Medications - No data to display  Initial Impression / Assessment and Plan / UC Course  I have reviewed the triage vital signs and the nursing notes.  Pertinent labs & imaging results that were  available during my care of the patient were reviewed by me and considered in my medical decision making (see chart for details).     Patient is well-appearing, afebrile, nontoxic, nontachycardic.  Urine pregnancy was negative.  Basic blood work was obtained including CBC, CMP, TSH.  We will contact patient if this is abnormal and changes our treatment plan.  We discussed that abnormal uterine bleeding is likely related to the hormonal changes of discontinuing the Depo-Provera and starting OCP.  Given she continues to have heavy significant bleeding despite taking her OCP regularly we will pause this and start high-dose progesterone to pause her bleeding.  Norethindrone 10 mg daily for 1 month was sent to pharmacy and we discussed that she can expect to have withdrawal bleeding soon as she stops this medication but the goal is to have her see her OB/GYN before she runs out of medicine.  She is already scheduled with her OB/GYN sometime in January.  We discussed that if she continues to have heavy bleeding or has additional symptoms including pelvic pain, abdominal pain, chest pain, shortness of breath, lightheadedness, palpitations she needs to be seen emergently.  Strict return precautions given.  No evidence of acute infection on physical exam that would warrant initiation of antibiotics.  Flu testing was negative.  COVID test is pending.  She is young and otherwise healthy so not a candidate for antiviral therapy.  Will treat symptomatically and she was prescribed ipratropium nasal spray to help with congestion as well as Promethazine DM for cough.  We discussed that Promethazine DM is sedating and she  should not drive or drink alcohol while taking it.  She can use over-the-counter medications for additional symptom relief.  If her symptoms are not improving within a week she is to return for reevaluation.  Strict turn precautions given.  Excuse note provided.  Final Clinical Impressions(s) / UC Diagnoses    Final diagnoses:  Abnormal uterine bleeding (AUB)  Upper respiratory tract infection, unspecified type  Nasal congestion     Discharge Instructions      You were negative for pregnancy.  We will contact you if any of your blood work is abnormal.  Please pause your birth control and start norethindrone daily as prescribed.  You will have bleeding once you stop this medication so ideally I would like you to see your primary care/OB/GYN before the medicine runs out.  If you still have bleeding despite this medication or have any kind of chest pain, shortness of breath, lightheadedness, weakness, heart racing you need to be seen immediately.  You were negative for flu.  We will contact you if you are positive for COVID.  Use ipratropium nasal spray to help with congestion.  Use Promethazine DM for cough.  This will make you sleepy so do not drive or drink alcohol while taking it.  Make sure that you rest and drink plenty of fluid.  If your symptoms not improving within a week or if anything worsens you need to be seen immediately.     ED Prescriptions     Medication Sig Dispense Auth. Provider   norethindrone (AYGESTIN) 5 MG tablet Take 2 tablets (10 mg total) by mouth daily. 60 tablet Solon Alban K, PA-C   ipratropium (ATROVENT) 0.03 % nasal spray Place 2 sprays into both nostrils every 12 (twelve) hours. 30 mL Jaeven Wanzer K, PA-C   promethazine-dextromethorphan (PROMETHAZINE-DM) 6.25-15 MG/5ML syrup Take 5 mLs by mouth at bedtime as needed for cough. 118 mL Kuuipo Anzaldo K, PA-C      PDMP not reviewed this encounter.   Jeani Hawking, PA-C 07/11/23 2045

## 2023-07-11 NOTE — Telephone Encounter (Signed)
Pharmacy change

## 2023-07-11 NOTE — ED Triage Notes (Addendum)
Reports cough since 12/24 with headache n/v, scratchy throat right ear pain, fatigue. Also reports she had similar symptoms last time she was pregnant (child is 22 yo ). States she has been on her period since 12/17 with cramps and large blood clots

## 2023-07-11 NOTE — Discharge Instructions (Signed)
You were negative for pregnancy.  We will contact you if any of your blood work is abnormal.  Please pause your birth control and start norethindrone daily as prescribed.  You will have bleeding once you stop this medication so ideally I would like you to see your primary care/OB/GYN before the medicine runs out.  If you still have bleeding despite this medication or have any kind of chest pain, shortness of breath, lightheadedness, weakness, heart racing you need to be seen immediately.  You were negative for flu.  We will contact you if you are positive for COVID.  Use ipratropium nasal spray to help with congestion.  Use Promethazine DM for cough.  This will make you sleepy so do not drive or drink alcohol while taking it.  Make sure that you rest and drink plenty of fluid.  If your symptoms not improving within a week or if anything worsens you need to be seen immediately.

## 2023-07-12 LAB — SARS CORONAVIRUS 2 (TAT 6-24 HRS): SARS Coronavirus 2: NEGATIVE

## 2023-07-13 LAB — CBC WITH DIFFERENTIAL/PLATELET
Basophils Absolute: 0.1 10*3/uL (ref 0.0–0.2)
Basos: 1 %
EOS (ABSOLUTE): 0.2 10*3/uL (ref 0.0–0.4)
Eos: 2 %
Hematocrit: 33.3 % — ABNORMAL LOW (ref 34.0–46.6)
Hemoglobin: 10.2 g/dL — ABNORMAL LOW (ref 11.1–15.9)
Immature Grans (Abs): 0 10*3/uL (ref 0.0–0.1)
Immature Granulocytes: 0 %
Lymphocytes Absolute: 2.1 10*3/uL (ref 0.7–3.1)
Lymphs: 29 %
MCH: 26.7 pg (ref 26.6–33.0)
MCHC: 30.6 g/dL — ABNORMAL LOW (ref 31.5–35.7)
MCV: 87 fL (ref 79–97)
Monocytes Absolute: 0.7 10*3/uL (ref 0.1–0.9)
Monocytes: 10 %
Neutrophils Absolute: 4.2 10*3/uL (ref 1.4–7.0)
Neutrophils: 58 %
Platelets: 196 10*3/uL (ref 150–450)
RBC: 3.82 x10E6/uL (ref 3.77–5.28)
RDW: 13.6 % (ref 11.7–15.4)
WBC: 7.2 10*3/uL (ref 3.4–10.8)

## 2023-07-13 LAB — COMPREHENSIVE METABOLIC PANEL
ALT: 11 [IU]/L (ref 0–32)
AST: 14 [IU]/L (ref 0–40)
Albumin: 4.2 g/dL (ref 4.0–5.0)
Alkaline Phosphatase: 64 [IU]/L (ref 42–106)
BUN/Creatinine Ratio: 11 (ref 9–23)
BUN: 8 mg/dL (ref 6–20)
Bilirubin Total: 0.4 mg/dL (ref 0.0–1.2)
CO2: 20 mmol/L (ref 20–29)
Calcium: 8.8 mg/dL (ref 8.7–10.2)
Chloride: 106 mmol/L (ref 96–106)
Creatinine, Ser: 0.75 mg/dL (ref 0.57–1.00)
Globulin, Total: 2.8 g/dL (ref 1.5–4.5)
Glucose: 86 mg/dL (ref 70–99)
Potassium: 3.9 mmol/L (ref 3.5–5.2)
Sodium: 139 mmol/L (ref 134–144)
Total Protein: 7 g/dL (ref 6.0–8.5)
eGFR: 117 mL/min/{1.73_m2} (ref >59)

## 2023-07-13 LAB — TSH: TSH: 0.613 u[IU]/mL (ref 0.450–4.500)

## 2023-07-17 ENCOUNTER — Telehealth: Payer: Self-pay

## 2023-07-17 NOTE — Telephone Encounter (Signed)
 Patient called stating she had a heavy period on 07/01/23-07/11/23 then her period started again on 07/15/23. Patient states she doesn't know how many pads she soaks in one hour but she has used a total of 9 pads today. Advised patient to go to the ED to be seen.Patient is scheduled for an OV on 07/24/23. Understanding was voiced. Chaniya Genter l Kriss Ishler, CMA

## 2023-07-24 ENCOUNTER — Other Ambulatory Visit (HOSPITAL_COMMUNITY)
Admission: RE | Admit: 2023-07-24 | Discharge: 2023-07-24 | Disposition: A | Discharge status: Home / Self Care | Payer: Medicaid Other | Source: Ambulatory Visit | Attending: Family Medicine | Admitting: Family Medicine

## 2023-07-24 ENCOUNTER — Ambulatory Visit (INDEPENDENT_AMBULATORY_CARE_PROVIDER_SITE_OTHER): Payer: Medicaid Other | Admitting: Family Medicine

## 2023-07-24 ENCOUNTER — Encounter: Payer: Self-pay | Admitting: Family Medicine

## 2023-07-24 VITALS — BP 102/61 | HR 70 | Ht 69.0 in | Wt 107.0 lb

## 2023-07-24 DIAGNOSIS — N920 Excessive and frequent menstruation with regular cycle: Secondary | ICD-10-CM | POA: Diagnosis not present

## 2023-07-24 DIAGNOSIS — N939 Abnormal uterine and vaginal bleeding, unspecified: Secondary | ICD-10-CM

## 2023-07-25 LAB — CERVICOVAGINAL ANCILLARY ONLY
Bacterial Vaginitis (gardnerella): NEGATIVE
Candida Glabrata: NEGATIVE
Candida Vaginitis: NEGATIVE
Chlamydia: NEGATIVE
Comment: NEGATIVE
Comment: NEGATIVE
Comment: NEGATIVE
Comment: NEGATIVE
Comment: NEGATIVE
Comment: NORMAL
Neisseria Gonorrhea: NEGATIVE
Trichomonas: NEGATIVE

## 2023-07-25 LAB — CBC
Hematocrit: 33.6 % — ABNORMAL LOW (ref 34.0–46.6)
Hemoglobin: 10.4 g/dL — ABNORMAL LOW (ref 11.1–15.9)
MCH: 26.1 pg — ABNORMAL LOW (ref 26.6–33.0)
MCHC: 31 g/dL — ABNORMAL LOW (ref 31.5–35.7)
MCV: 84 fL (ref 79–97)
Platelets: 206 10*3/uL (ref 150–450)
RBC: 3.98 x10E6/uL (ref 3.77–5.28)
RDW: 12.9 % (ref 11.7–15.4)
WBC: 6.4 10*3/uL (ref 3.4–10.8)

## 2023-07-25 LAB — BETA HCG QUANT (REF LAB): hCG Quant: <1 m[IU]/mL

## 2023-07-25 MED ORDER — NORETHINDRONE ACETATE 5 MG PO TABS: 5.0000 mg | ORAL_TABLET | Freq: Every day | ORAL | 0 refills | Status: DC

## 2023-07-25 NOTE — Progress Notes (Signed)
   Subjective:    Patient ID: Melissa Farley, female    DOB: 2000-11-19, 23 y.o.   MRN: [id number]  HPI Patient seen for abnormal uterine bleeding.  She reports having irregular bleeding from 12/17 to 12/27.  She had 4 days off and restarted her period on 12/31 for a few more days, then it stopped again.  On 12/27 she was seen emergency department and was started on norethindrone  10 mg daily.  When she started this, her bleeding stopped.  Prior to this, she did transition from Depo to COC's.   Review of Systems     Objective:   Physical Exam Vitals reviewed. Exam conducted with a chaperone present.  Constitutional:      Appearance: Normal appearance.  Cardiovascular:     Rate and Rhythm: Normal rate and regular rhythm.  Abdominal:     Hernia: There is no hernia in the left inguinal area or right inguinal area.  Genitourinary:    Labia:        Right: No rash, tenderness or lesion.        Left: No rash, tenderness or lesion.      Vagina: No signs of injury and foreign body. No vaginal discharge, erythema, tenderness or bleeding.     Cervix: No cervical motion tenderness, friability or erythema.  Lymphadenopathy:     Lower Body: No right inguinal adenopathy. No left inguinal adenopathy.  Neurological:     Mental Status: She is alert.       Assessment & Plan:  1. Abnormal uterine bleeding (Primary) Check CBC, HCG. Will check US . Continue aygestin  - will try to decrease it to 5mg  daily, then transition to COC. - CBC - Beta hCG quant (ref lab) - Cervicovaginal ancillary only - US  PELVIC COMPLETE WITH TRANSVAGINAL; Future

## 2023-07-30 ENCOUNTER — Ambulatory Visit (HOSPITAL_BASED_OUTPATIENT_CLINIC_OR_DEPARTMENT_OTHER)
Admission: RE | Admit: 2023-07-30 | Discharge: 2023-07-30 | Disposition: A | Discharge status: Home / Self Care | Payer: Medicaid Other | Source: Ambulatory Visit | Attending: Family Medicine | Admitting: Family Medicine

## 2023-07-30 DIAGNOSIS — N939 Abnormal uterine and vaginal bleeding, unspecified: Secondary | ICD-10-CM | POA: Diagnosis not present

## 2023-07-31 ENCOUNTER — Telehealth: Payer: Self-pay

## 2023-07-31 NOTE — Telephone Encounter (Signed)
Patient called requesting results. Advised patient when the provider reviews the results the office will give patient a call.

## 2023-08-05 ENCOUNTER — Encounter: Payer: Self-pay | Admitting: Family Medicine

## 2023-08-07 ENCOUNTER — Encounter: Payer: Self-pay | Admitting: Family Medicine

## 2023-08-07 ENCOUNTER — Encounter (HOSPITAL_BASED_OUTPATIENT_CLINIC_OR_DEPARTMENT_OTHER): Payer: Self-pay | Admitting: *Deleted

## 2023-08-07 ENCOUNTER — Other Ambulatory Visit: Payer: Self-pay

## 2023-08-07 ENCOUNTER — Ambulatory Visit: Payer: Medicaid Other | Admitting: Family Medicine

## 2023-08-07 ENCOUNTER — Emergency Department (HOSPITAL_BASED_OUTPATIENT_CLINIC_OR_DEPARTMENT_OTHER)
Admission: EM | Admit: 2023-08-07 | Discharge: 2023-08-07 | Disposition: A | Discharge status: Home / Self Care | Payer: Medicaid Other | Attending: Emergency Medicine | Admitting: Emergency Medicine

## 2023-08-07 ENCOUNTER — Emergency Department (HOSPITAL_BASED_OUTPATIENT_CLINIC_OR_DEPARTMENT_OTHER): Payer: Medicaid Other

## 2023-08-07 VITALS — BP 111/67 | HR 80 | Ht 69.0 in | Wt 104.1 lb

## 2023-08-07 DIAGNOSIS — R197 Diarrhea, unspecified: Secondary | ICD-10-CM | POA: Diagnosis not present

## 2023-08-07 DIAGNOSIS — R112 Nausea with vomiting, unspecified: Secondary | ICD-10-CM | POA: Diagnosis not present

## 2023-08-07 DIAGNOSIS — M545 Low back pain, unspecified: Secondary | ICD-10-CM | POA: Diagnosis not present

## 2023-08-07 DIAGNOSIS — R1033 Periumbilical pain: Secondary | ICD-10-CM | POA: Diagnosis not present

## 2023-08-07 DIAGNOSIS — Z20822 Contact with and (suspected) exposure to covid-19: Secondary | ICD-10-CM | POA: Diagnosis not present

## 2023-08-07 DIAGNOSIS — R109 Unspecified abdominal pain: Secondary | ICD-10-CM | POA: Diagnosis not present

## 2023-08-07 LAB — URINALYSIS, ROUTINE W REFLEX MICROSCOPIC
Bilirubin Urine: NEGATIVE
Glucose, UA: NEGATIVE mg/dL
Hgb urine dipstick: NEGATIVE
Ketones, ur: NEGATIVE mg/dL
Leukocytes,Ua: NEGATIVE
Nitrite: POSITIVE — AB
Protein, ur: 30 mg/dL — AB
Specific Gravity, Urine: >=1.03 (ref 1.005–1.030)
pH: 6 (ref 5.0–8.0)

## 2023-08-07 LAB — URINALYSIS, MICROSCOPIC (REFLEX)

## 2023-08-07 LAB — COMPREHENSIVE METABOLIC PANEL
ALT: 14 U/L (ref 0–44)
AST: 17 U/L (ref 15–41)
Albumin: 3.5 g/dL (ref 3.5–5.0)
Alkaline Phosphatase: 55 U/L (ref 38–126)
Anion gap: 6 (ref 5–15)
BUN: 14 mg/dL (ref 6–20)
CO2: 24 mmol/L (ref 22–32)
Calcium: 8.9 mg/dL (ref 8.9–10.3)
Chloride: 105 mmol/L (ref 98–111)
Creatinine, Ser: 0.58 mg/dL (ref 0.44–1.00)
GFR, Estimated: >60 mL/min (ref >60)
Glucose, Bld: 99 mg/dL (ref 70–99)
Potassium: 3.6 mmol/L (ref 3.5–5.1)
Sodium: 135 mmol/L (ref 135–145)
Total Bilirubin: 0.3 mg/dL (ref 0.0–1.2)
Total Protein: 7 g/dL (ref 6.5–8.1)

## 2023-08-07 LAB — CBC
HCT: 33.1 % — ABNORMAL LOW (ref 36.0–46.0)
Hemoglobin: 10.4 g/dL — ABNORMAL LOW (ref 12.0–15.0)
MCH: 26.1 pg (ref 26.0–34.0)
MCHC: 31.4 g/dL (ref 30.0–36.0)
MCV: 83 fL (ref 80.0–100.0)
Platelets: 164 10*3/uL (ref 150–400)
RBC: 3.99 MIL/uL (ref 3.87–5.11)
RDW: 13.5 % (ref 11.5–15.5)
WBC: 4 10*3/uL (ref 4.0–10.5)
nRBC: 0 % (ref 0.0–0.2)

## 2023-08-07 LAB — PREGNANCY, URINE: Preg Test, Ur: NEGATIVE

## 2023-08-07 LAB — RESP PANEL BY RT-PCR (RSV, FLU A&B, COVID)  RVPGX2
Influenza A by PCR: NEGATIVE
Influenza B by PCR: NEGATIVE
Resp Syncytial Virus by PCR: NEGATIVE
SARS Coronavirus 2 by RT PCR: NEGATIVE

## 2023-08-07 LAB — LIPASE, BLOOD: Lipase: 28 U/L (ref 11–51)

## 2023-08-07 MED ORDER — CIPROFLOXACIN HCL 500 MG PO TABS: 500.0000 mg | ORAL_TABLET | Freq: Two times a day (BID) | ORAL | 0 refills | Status: AC

## 2023-08-07 MED ORDER — LACTATED RINGERS IV BOLUS
1000.0000 mL | Freq: Once | INTRAVENOUS | Status: AC
  Administered 2023-08-07: 1000 mL via INTRAVENOUS

## 2023-08-07 MED ORDER — ACETAMINOPHEN 500 MG PO TABS
1000.0000 mg | ORAL_TABLET | Freq: Once | ORAL | Status: AC
  Administered 2023-08-07: 1000 mg via ORAL
  Filled 2023-08-07: qty 2

## 2023-08-07 MED ORDER — IOHEXOL 300 MG/ML  SOLN
80.0000 mL | Freq: Once | INTRAMUSCULAR | Status: AC | PRN
  Administered 2023-08-07: 80 mL via INTRAVENOUS

## 2023-08-07 MED ORDER — ONDANSETRON 4 MG PO TBDP: 4.0000 mg | ORAL_TABLET | Freq: Three times a day (TID) | ORAL | 0 refills | Status: DC | PRN

## 2023-08-07 MED ORDER — ONDANSETRON HCL 4 MG/2ML IJ SOLN
4.0000 mg | Freq: Once | INTRAMUSCULAR | Status: AC
  Administered 2023-08-07: 4 mg via INTRAVENOUS
  Filled 2023-08-07: qty 2

## 2023-08-07 NOTE — Progress Notes (Signed)
   Subjective:    Patient ID: Melissa Farley, female    DOB: 02/08/2001, 23 y.o.   MRN: [id number]  HPI  Seen initially for annual exam, but this was changed to a regular visit due to patient's symptoms.  She describes having 4 days of intermittent evertors and chills.  She has been intolerant of oral foods and liquids having profuse vomiting and an episode of diarrhea.  She has been feeling very weak with tingling in her extremities.  In addition to her nausea and vomiting, she has had a lot of abdominal cramping and back pain.  She attempted to go to work Wednesday night, but she fell backwards because she got dizzy.  Fortunately, a coworker caught her.  Due to her symptoms, she was sent home from work.  In addition to all of this, she has had shortness of breath and the inability to walk long distances.  Review of Systems     Objective:   Physical Exam Vitals reviewed.  Constitutional:      Appearance: Normal appearance.  Cardiovascular:     Rate and Rhythm: Normal rate and regular rhythm.  Pulmonary:     Effort: Pulmonary effort is normal.     Breath sounds: Normal breath sounds.  Abdominal:     General: Abdomen is flat. There is no distension.     Palpations: Abdomen is soft.     Tenderness: There is abdominal tenderness. There is guarding. There is no rebound.     Comments: Abdominal mass in mid abdomen, just above umbilicus.   Skin:    Capillary Refill: Capillary refill takes less than 2 seconds.  Neurological:     General: No focal deficit present.     Mental Status: She is alert.  Psychiatric:        Mood and Affect: Mood normal.        Behavior: Behavior normal.        Thought Content: Thought content normal.        Judgment: Judgment normal.         Assessment & Plan:  1. Periumbilical abdominal pain (Primary) It appears that the patient has a gastroenteritis of some sort.  Due to her symptoms, inability to eat for 4 days, severe abdominal pain and this mass  that is palpable and tender, I sent the patient to the emergency department.  I called the emergency department and spoke with Dr. Wallace Cullens and discussed my concerns.

## 2023-08-07 NOTE — ED Provider Notes (Signed)
[facility] Provider Note   CSN: [id number] Arrival date & time: 08/07/23  1223     History  Chief Complaint  Patient presents with   Nausea    Melissa Farley is a 23 y.o. female.  HPI 23 year old female presenting for multiple concerns.  Patient states starting on Sunday she had some difficulty sleeping and since that she has had intermittent nausea, vomiting, some diarrhea of which is nonbloody.  She has had pain primarily to her mid abdomen which is cramping and sometimes she has pain to her lower back.  She also has felt some occasional tingling in her legs and has had some episodes of presyncope and dizziness.  The other day she was at work and fell backwards because she was dizzy but did not pass out.  She was caught by her coworker and did not hit her head.  She went to her OB today who was concerned for possible abdominal mass was sent her here.  She has had very poor appetite recently.  No dysuria or hematuria.  Back pain is generalized.  No saddle anesthesia or bowel or bladder difficulties.     Home Medications Prior to Admission medications   Medication Sig Start Date End Date Taking? Authorizing Provider  ciprofloxacin (CIPRO) 500 MG tablet Take 1 tablet (500 mg total) by mouth every 12 (twelve) hours for 7 days. 08/07/23 08/14/23 Yes Laurence Spates, MD  ondansetron (ZOFRAN-ODT) 4 MG disintegrating tablet Take 1 tablet (4 mg total) by mouth every 8 (eight) hours as needed. 08/07/23  Yes Laurence Spates, MD  AUROVELA 24 FE 1-20 MG-MCG(24) tablet Take 1 tablet by mouth daily. Patient not taking: Reported on 07/24/2023 07/05/23   [provider]  ipratropium (ATROVENT) 0.03 % nasal spray Place 2 sprays into both nostrils every 12 (twelve) hours. Patient not taking: Reported on 07/24/2023 07/11/23   Raspet, Noberto Retort, PA-C  norethindrone (AYGESTIN) 5 MG tablet Take 1 tablet (5 mg total) by mouth daily. 07/25/23   Levie Heritage, DO  promethazine-dextromethorphan (PROMETHAZINE-DM) 6.25-15 MG/5ML syrup Take 5 mLs by mouth at bedtime as needed for cough. Patient not taking: Reported on 07/24/2023 07/11/23   Raspet, Noberto Retort, PA-C      Allergies    Penicillins    Review of Systems   Review of Systems Review of systems completed and notable as per HPI.  ROS otherwise negative.   Physical Exam Updated Vital Signs BP 112/70   Pulse 70   Temp 97.8 F (36.6 C)   Resp 16   LMP 07/15/2023 Comment: has been having lots of vaginal bleeding in january  SpO2 100%  Physical Exam Vitals and nursing note reviewed.  Constitutional:      General: She is not in acute distress.    Appearance: She is well-developed.  HENT:     Head: Normocephalic and atraumatic.     Mouth/Throat:     Mouth: Mucous membranes are moist.     Pharynx: Oropharynx is clear.  Eyes:     Extraocular Movements: Extraocular movements intact.     Conjunctiva/sclera: Conjunctivae normal.     Pupils: Pupils are equal, round, and reactive to light.  Cardiovascular:     Rate and Rhythm: Normal rate and regular rhythm.     Pulses: Normal pulses.     Heart sounds: Normal heart sounds. No murmur heard. Pulmonary:     Effort: Pulmonary effort is normal. No respiratory distress.  Breath sounds: Normal breath sounds.  Abdominal:     Palpations: Abdomen is soft.     Tenderness: There is no abdominal tenderness. There is guarding. There is no right CVA tenderness, left CVA tenderness or rebound.     Comments: Slight fullness to the left abdomen just left of the umbilicus.  Musculoskeletal:        General: No swelling.     Cervical back: Neck supple.     Comments: Some left-sided lumbar paraspinal tenderness.  No midline tenderness.  Negative straight leg raise bilaterally.  She has normal strength and sensation throughout all extremities including at the hip, knee, ankle bilaterally.  DP and PT pulses are normal.  Skin:    General: Skin is warm  and dry.     Capillary Refill: Capillary refill takes less than 2 seconds.  Neurological:     General: No focal deficit present.     Mental Status: She is alert and oriented to person, place, and time. Mental status is at baseline.  Psychiatric:        Mood and Affect: Mood normal.     ED Results / Procedures / Treatments   Labs (all labs ordered are listed, but only abnormal results are displayed) Labs Reviewed  CBC - Abnormal; Notable for the following components:      Result Value   Hemoglobin 10.4 (*)    HCT 33.1 (*)    All other components within normal limits  URINALYSIS, ROUTINE W REFLEX MICROSCOPIC - Abnormal; Notable for the following components:   Protein, ur 30 (*)    Nitrite POSITIVE (*)    All other components within normal limits  URINALYSIS, MICROSCOPIC (REFLEX) - Abnormal; Notable for the following components:   Bacteria, UA MANY (*)    All other components within normal limits  RESP PANEL BY RT-PCR (RSV, FLU A&B, COVID)  RVPGX2  URINE CULTURE  LIPASE, BLOOD  COMPREHENSIVE METABOLIC PANEL  PREGNANCY, URINE    EKG None  Radiology CT ABDOMEN PELVIS W CONTRAST Result Date: 08/07/2023 CLINICAL DATA:  Pale, lack of appetite, nausea and vomiting, abdominal pain EXAM: CT ABDOMEN AND PELVIS WITH CONTRAST CT LUMBAR SPINE WITH CONTRAST TECHNIQUE: Multidetector CT imaging of the abdomen and pelvis was performed using the standard protocol following bolus administration of intravenous contrast. Multidetector CT imaging of the lumbar spine was performed using the standard protocol following bolus administration of intravenous contrast. RADIATION DOSE REDUCTION: This exam was performed according to the departmental dose-optimization program which includes automated exposure control, adjustment of the mA and/or kV according to patient size and/or use of iterative reconstruction technique. CONTRAST:  80mL OMNIPAQUE IOHEXOL 300 MG/ML  SOLN COMPARISON:  None Available. FINDINGS:  CT ABDOMEN PELVIS FINDINGS Lower chest: No acute findings. Hepatobiliary: No solid liver abnormality is seen. Focal fatty deposition adjacent to the falciform ligament, benign, requiring no further follow-up or characterization. No gallstones, gallbladder wall thickening, or biliary dilatation. Pancreas: Unremarkable. No pancreatic ductal dilatation or surrounding inflammatory changes. Spleen: Normal in size without significant abnormality. Adrenals/Urinary Tract: Adrenal glands are unremarkable. Kidneys are normal, without renal calculi, solid lesion, or hydronephrosis. Bladder is unremarkable. Stomach/Bowel: Stomach is within normal limits. Appendix appears normal. No evidence of bowel wall thickening, distention, or inflammatory changes. Vascular/Lymphatic: No significant vascular findings are present. No enlarged abdominal or pelvic lymph nodes. Reproductive: No mass or other abnormality. Small bilateral functional follicles, benign, requiring no specific further follow-up or characterization Other: No abdominal wall hernia or abnormality. Small volume low  intermediate attenuation fluid in the low pelvis. Musculoskeletal: No acute osseous findings. CT LUMBAR SPINE FINDINGS Alignment: Normal lumbar lordosis. Vertebral bodies: Intact. No fracture or dislocation. Disc spaces: Intact. Paraspinous soft tissues: Unremarkable. IMPRESSION: 1. No acute CT findings of the abdomen or pelvis to explain abdominal pain. 2. Small volume low intermediate attenuation fluid in the low pelvis, nonspecific and possibly functional in the reproductive age setting. 3. No fracture or dislocation of the lumbar spine. Disc spaces and vertebral body heights preserved. Electronically Signed   By: Jearld Lesch M.D.   On: 08/07/2023 17:24   CT L-SPINE NO CHARGE Result Date: 08/07/2023 CLINICAL DATA:  Pale, lack of appetite, nausea and vomiting, abdominal pain EXAM: CT ABDOMEN AND PELVIS WITH CONTRAST CT LUMBAR SPINE WITH CONTRAST  TECHNIQUE: Multidetector CT imaging of the abdomen and pelvis was performed using the standard protocol following bolus administration of intravenous contrast. Multidetector CT imaging of the lumbar spine was performed using the standard protocol following bolus administration of intravenous contrast. RADIATION DOSE REDUCTION: This exam was performed according to the departmental dose-optimization program which includes automated exposure control, adjustment of the mA and/or kV according to patient size and/or use of iterative reconstruction technique. CONTRAST:  80mL OMNIPAQUE IOHEXOL 300 MG/ML  SOLN COMPARISON:  None Available. FINDINGS: CT ABDOMEN PELVIS FINDINGS Lower chest: No acute findings. Hepatobiliary: No solid liver abnormality is seen. Focal fatty deposition adjacent to the falciform ligament, benign, requiring no further follow-up or characterization. No gallstones, gallbladder wall thickening, or biliary dilatation. Pancreas: Unremarkable. No pancreatic ductal dilatation or surrounding inflammatory changes. Spleen: Normal in size without significant abnormality. Adrenals/Urinary Tract: Adrenal glands are unremarkable. Kidneys are normal, without renal calculi, solid lesion, or hydronephrosis. Bladder is unremarkable. Stomach/Bowel: Stomach is within normal limits. Appendix appears normal. No evidence of bowel wall thickening, distention, or inflammatory changes. Vascular/Lymphatic: No significant vascular findings are present. No enlarged abdominal or pelvic lymph nodes. Reproductive: No mass or other abnormality. Small bilateral functional follicles, benign, requiring no specific further follow-up or characterization Other: No abdominal wall hernia or abnormality. Small volume low intermediate attenuation fluid in the low pelvis. Musculoskeletal: No acute osseous findings. CT LUMBAR SPINE FINDINGS Alignment: Normal lumbar lordosis. Vertebral bodies: Intact. No fracture or dislocation. Disc spaces:  Intact. Paraspinous soft tissues: Unremarkable. IMPRESSION: 1. No acute CT findings of the abdomen or pelvis to explain abdominal pain. 2. Small volume low intermediate attenuation fluid in the low pelvis, nonspecific and possibly functional in the reproductive age setting. 3. No fracture or dislocation of the lumbar spine. Disc spaces and vertebral body heights preserved. Electronically Signed   By: Jearld Lesch M.D.   On: 08/07/2023 17:24    Procedures Procedures    Medications Ordered in ED Medications  acetaminophen (TYLENOL) tablet 1,000 mg (1,000 mg Oral Given 08/07/23 1559)  lactated ringers bolus 1,000 mL (1,000 mLs Intravenous New Bag/Given 08/07/23 1558)  iohexol (OMNIPAQUE) 300 MG/ML solution 80 mL (80 mLs Intravenous Contrast Given 08/07/23 1627)  ondansetron (ZOFRAN) injection 4 mg (4 mg Intravenous Given 08/07/23 1747)    ED Course/ Medical Decision Making/ A&P                                 Medical Decision Making Amount and/or Complexity of Data Reviewed Labs: ordered. Radiology: ordered.  Risk OTC drugs. Prescription drug management.   Medical Decision Making:   Melissa Farley is a 23 y.o. female who presented  to the ED today with abdominal pain, back pain, nausea, vomiting, presyncope.  Vital signs reviewed.  On exam she is overall well-appearing.  She has had symptoms since "Sunday predominantly nausea, vomiting, some diarrhea and abdominal pain and poor appetite.  She has had some back pain as well, seems more positional, no true trauma.  She occasionally feels some tingling in her legs but this is currently resolved and seems more related to how she is sitting.  She has no radicular pain or signs of cauda equina syndrome or spinal cord compression.  On exam she has mild generalized abdominal tenderness and she has a fullness in the left hemiabdomen.  She is quite thin, obtain CT scan to eval for internal pathology or mass.  Labwork is notable for mild anemia as  well as bacteriuria although she has no hematuria, dysuria or leukocyturia to suggest active infection.   Patient placed on continuous vitals and telemetry monitoring while in ED which was reviewed periodically.  Reviewed and confirmed nursing documentation for past medical history, family history, social history.  Reassessment and Plan:   Reviewed again with patient, she does report some left CVA pain and possible urinary symptoms.  Given this we will treat for possible UTI/pyelo and culture urine.  CT scan of the abdomen and lumbar spine are overall unremarkable.  She small amount of fluid in the pelvis likely physiologic.  She has no adnexal mass or symptoms concerning for torsion.  Given recent vomiting, diarrhea I suspect symptoms are probably due to gastroenteritis.  Will treat with Zofran at home.  After Zofran here she is tolerating p.o. and has no nausea or vomiting and pain is improved.  Patient is comfortable this plan.  Recommend close PCP follow-up.   Patient's presentation is most consistent with acute complicated illness / injury requiring diagnostic workup.           Final Clinical Impression(s) / ED Diagnoses Final diagnoses:  Nausea and vomiting, unspecified vomiting type    Rx / DC Orders ED Discharge Orders          Ordered    ondansetron (ZOFRAN-ODT) 4 MG disintegrating tablet  Every 8 hours PRN        08/07/23 1826    ciprofloxacin (CIPRO) 500 MG tablet  Every 12 hours        01" /23/25 1826              Laurence Spates, MD 08/07/23 [phone]

## 2023-08-07 NOTE — ED Triage Notes (Signed)
Pt was seen for her annual visit with OB and MD sent her here due to concerns she shared with him.  Pt appears pale, she reports that she has been having lack of appetite, nausea and vomiting, she has been feeling weak and dizzy with activity.  Pt reports that her feeling unwell began Sunday am.  Pt reports that she has been having a fever.

## 2023-08-07 NOTE — Discharge Instructions (Signed)
Your CT scan was reassuring.  Your urinalysis concerning for possible urinary tract infection so you are being placed on antibiotics.  You should follow-up closely with your primary care doctor.  If you develop severe pain, persistent fever, weakness or numbness or difficulty going to the bathroom you should return to the ED.

## 2023-08-07 NOTE — Progress Notes (Signed)
Pt. Presents for annual. Pt. Has had irregular periods since 10/24. Pt. States that she has not been able to eat since 1/19. Pt. Has numbness in back and legs, and has dizziness. Can't lift over 10 lbs. Without tumbling over.

## 2023-08-09 LAB — URINE CULTURE: Culture: >=100000 — AB

## 2023-08-10 ENCOUNTER — Telehealth (HOSPITAL_BASED_OUTPATIENT_CLINIC_OR_DEPARTMENT_OTHER): Payer: Self-pay | Admitting: *Deleted

## 2023-08-10 NOTE — Telephone Encounter (Signed)
Post ED Visit - Positive Culture Follow-up  Culture report reviewed by antimicrobial stewardship pharmacist: [facility] []  Enzo Bi, Pharm.D. []  Celedonio Miyamoto, Pharm.D., BCPS AQ-ID []  Garvin Fila, Pharm.D., BCPS []  Georgina Pillion, Pharm.D., BCPS []  [place], [address].D., BCPS, AAHIVP []  Estella Husk, Pharm.D., BCPS, AAHIVP []  Lysle Pearl, PharmD, BCPS []  Phillips Climes, PharmD, BCPS []  Agapito Games, PharmD, BCPS []  Verlan Friends, PharmD []  Mervyn Gay, PharmD, BCPS [x]  Ivery Quale,  PharmD  [facility] []  Len Childs, PharmD []  Greer Pickerel, PharmD []  Adalberto Cole, PharmD []  Perlie Gold, Rph []  Lonell Face) Jean Rosenthal, PharmD []  Earl Many, PharmD []  Junita Push, PharmD []  Dorna Leitz, PharmD []  Terrilee Files, PharmD []  Lynann Beaver, PharmD []  Keturah Barre, PharmD []  Loralee Pacas, PharmD []  Bernadene Person, PharmD   Positive urine culture Treated with Ciprofloxacin, organism sensitive to the same and no further patient follow-up is required at this time.  Patsey Berthold 08/10/2023, 8:29 AM

## 2023-08-28 ENCOUNTER — Inpatient Hospital Stay (HOSPITAL_COMMUNITY)
Admission: AD | Admit: 2023-08-28 | Discharge: 2023-08-28 | Disposition: A | Discharge status: Home / Self Care | Payer: Medicaid Other | Attending: Obstetrics & Gynecology | Admitting: Obstetrics & Gynecology

## 2023-08-28 ENCOUNTER — Ambulatory Visit (HOSPITAL_COMMUNITY)
Admission: EM | Admit: 2023-08-28 | Discharge: 2023-08-28 | Disposition: A | Discharge status: Home / Self Care | Payer: Medicaid Other | Attending: Emergency Medicine | Admitting: Emergency Medicine

## 2023-08-28 ENCOUNTER — Other Ambulatory Visit: Payer: Self-pay

## 2023-08-28 ENCOUNTER — Encounter (HOSPITAL_COMMUNITY): Payer: Self-pay | Admitting: *Deleted

## 2023-08-28 DIAGNOSIS — N926 Irregular menstruation, unspecified: Secondary | ICD-10-CM | POA: Insufficient documentation

## 2023-08-28 DIAGNOSIS — R519 Headache, unspecified: Secondary | ICD-10-CM | POA: Insufficient documentation

## 2023-08-28 DIAGNOSIS — R6889 Other general symptoms and signs: Secondary | ICD-10-CM | POA: Insufficient documentation

## 2023-08-28 DIAGNOSIS — Z3202 Encounter for pregnancy test, result negative: Secondary | ICD-10-CM | POA: Diagnosis not present

## 2023-08-28 DIAGNOSIS — R1030 Lower abdominal pain, unspecified: Secondary | ICD-10-CM | POA: Insufficient documentation

## 2023-08-28 DIAGNOSIS — R52 Pain, unspecified: Secondary | ICD-10-CM | POA: Diagnosis not present

## 2023-08-28 DIAGNOSIS — N898 Other specified noninflammatory disorders of vagina: Secondary | ICD-10-CM | POA: Diagnosis not present

## 2023-08-28 LAB — POCT URINALYSIS DIP (MANUAL ENTRY)
Bilirubin, UA: NEGATIVE
Blood, UA: NEGATIVE
Glucose, UA: NEGATIVE mg/dL
Ketones, POC UA: NEGATIVE mg/dL
Leukocytes, UA: NEGATIVE
Nitrite, UA: NEGATIVE
Spec Grav, UA: 1.02 (ref 1.010–1.025)
Urobilinogen, UA: 1 U/dL
pH, UA: 6.5 (ref 5.0–8.0)

## 2023-08-28 LAB — CBC
HCT: 32.9 % — ABNORMAL LOW (ref 36.0–46.0)
Hemoglobin: 10.3 g/dL — ABNORMAL LOW (ref 12.0–15.0)
MCH: 25.6 pg — ABNORMAL LOW (ref 26.0–34.0)
MCHC: 31.3 g/dL (ref 30.0–36.0)
MCV: 81.6 fL (ref 80.0–100.0)
Platelets: 100 10*3/uL — ABNORMAL LOW (ref 150–400)
RBC: 4.03 MIL/uL (ref 3.87–5.11)
RDW: 13.6 % (ref 11.5–15.5)
WBC: 5.2 10*3/uL (ref 4.0–10.5)
nRBC: 0 % (ref 0.0–0.2)

## 2023-08-28 LAB — HCG, QUANTITATIVE, PREGNANCY: hCG, Beta Chain, Quant, S: <1 m[IU]/mL (ref <5)

## 2023-08-28 LAB — POCT INFLUENZA A/B
Influenza A, POC: NEGATIVE
Influenza B, POC: NEGATIVE

## 2023-08-28 LAB — POCT PREGNANCY, URINE: Preg Test, Ur: NEGATIVE

## 2023-08-28 MED ORDER — METRONIDAZOLE 500 MG PO TABS: 500.0000 mg | ORAL_TABLET | Freq: Two times a day (BID) | ORAL | 0 refills | Status: DC

## 2023-08-28 NOTE — ED Provider Notes (Signed)
[facility]    CSN: [id number] Arrival date & time: 08/28/23  0827      History   Chief Complaint Chief Complaint  Patient presents with   Fever   Headache   Abdominal Pain    HPI Melissa Farley is a 23 y.o. female.   Patient presents today with several complaints she was seen [facility] today for a pregnancy test patient states that she is 9 days late for her menstrual cycle.  Has lower abdominal pain vaginal discharge of white thick consistency.  Patient is not currently taking any birth control.  Intermittent fevers.  Was treated for a UTI several weeks ago and symptoms have never fully resolved.  Does have a history of ovarian cyst.     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   Gastroesophageal reflux disease without esophagitis 06/24/2019   Dermatitis due to cat hair 05/21/2019   BMI (body mass index), pediatric, less than 5th percentile for age 39/29/2019   Vasovagal syncope 03/07/2015   Scoliosis of thoracic spine 04/19/2014   PTSD (post-traumatic stress disorder) 08/03/2013   Murmur 03/25/2013   ADHD (attention deficit hyperactivity disorder) 11/09/2012   Allergic rhinitis 11/09/2012    Past Surgical History:  Procedure Laterality Date   NO PAST SURGERIES      OB History     Gravida  2   Para  1   Term  1   Preterm  0   AB  1   Living  1      SAB  1   IAB  0   Ectopic   0   Multiple  0   Live Births  1            Home Medications    Prior to Admission medications   Medication Sig Start Date End Date Taking? Authorizing Provider  metroNIDAZOLE (FLAGYL) 500 MG tablet Take 1 tablet (500 mg total) by mouth 2 (two) times daily. 08/28/23  Yes Mahrukh Seguin L, NP  AUROVELA 24 FE 1-20 MG-MCG(24) tablet Take 1 tablet by mouth daily. Patient not taking: Reported on 07/24/2023 07/05/23   [provider]  ipratropium (ATROVENT) 0.03 % nasal spray Place 2 sprays into both nostrils every 12 (twelve) hours. Patient not taking: Reported on 07/24/2023 07/11/23   Raspet, Noberto Retort, PA-C  norethindrone (AYGESTIN) 5 MG tablet Take 1 tablet (5 mg total) by mouth daily. 07/25/23   Levie Heritage, DO  ondansetron (ZOFRAN-ODT) 4 MG disintegrating tablet Take 1 tablet (4 mg total) by mouth every 8 (eight) hours as needed. 08/07/23   Laurence Spates, MD    Family History Family History  Problem Relation Age of Onset   Miscarriages / Stillbirths Mother    Cancer Mother    Drug abuse Mother    Stroke Father  Asthma Father    Drug abuse Father    Asthma Sister    Behavior problems Sister    Asthma Maternal Aunt    Heart disease Maternal Uncle    HIV Paternal Uncle    Diabetes Maternal Grandfather    Hypertension Maternal Grandfather    Alcohol abuse Other    Birth defects Neg Hx     Social History Social History   Tobacco Use   Smoking status: Former    Types: E-cigarettes   Smokeless tobacco: Never   Tobacco comments:    Mom stated this was a short time freshman year.  Vaping Use   Vaping status: Former   Quit date: 06/14/2021   Substances: Nicotine, Flavoring  Substance Use Topics   Alcohol use: No   Drug use: No     Allergies   Penicillins   Review of Systems Review of Systems  Constitutional:        Reports of history of low-grade fevers none at this time  HENT:  Positive for postnasal drip.   Eyes: Negative.   Respiratory:  Negative.    Cardiovascular: Negative.   Gastrointestinal:  Positive for abdominal pain and diarrhea.       Left lower quadrant abdominal pain  Genitourinary:  Positive for frequency and vaginal discharge. Negative for vaginal bleeding and vaginal pain.  Neurological: Negative.      Physical Exam Triage Vital Signs ED Triage Vitals [08/28/23 1108]  Encounter Vitals Group     BP      Systolic BP Percentile      Diastolic BP Percentile      Pulse      Resp      Temp      Temp src      SpO2      Weight      Height      Head Circumference      Peak Flow      Pain Score 3     Pain Loc      Pain Education      Exclude from Growth Chart    No data found.  Updated Vital Signs BP 93/61   Pulse 87   Temp 98.3 F (36.8 C)   Resp 18   LMP 07/15/2023 Comment: has been having lots of vaginal bleeding in january  SpO2 97%   Visual Acuity Right Eye Distance:   Left Eye Distance:   Bilateral Distance:    Right Eye Near:   Left Eye Near:    Bilateral Near:     Physical Exam Constitutional:      Appearance: She is well-developed.  HENT:     Head: Normocephalic.  Eyes:     Extraocular Movements: Extraocular movements intact.  Cardiovascular:     Rate and Rhythm: Normal rate.  Pulmonary:     Effort: Pulmonary effort is normal.     Breath sounds: Normal breath sounds.  Abdominal:     Palpations: Abdomen is soft.     Tenderness: There is abdominal tenderness.     Comments: Left lower quadrant tenderness on palpation patient does have positive CVA tenderness but states that she has chronic lower back pain  Skin:    General: Skin is warm.  Neurological:     Mental Status: She is alert.      UC Treatments / Results  Labs (all labs ordered are listed, but only abnormal results are displayed) Labs Reviewed  POCT URINALYSIS DIP (MANUAL ENTRY) - Abnormal;  Notable for the following components:      Result Value   Protein Ur, POC trace (*)    All other components  within normal limits  HCG, QUANTITATIVE, PREGNANCY  CBC  POCT INFLUENZA A/B  CERVICOVAGINAL ANCILLARY ONLY    EKG   Radiology No results found.  Procedures Procedures (including critical care time)  Medications Ordered in UC Medications - No data to display  Initial Impression / Assessment and Plan / UC Course  I have reviewed the triage vital signs and the nursing notes.  Pertinent labs & imaging results that were available during my care of the patient were reviewed by me and considered in my medical decision making (see chart for details).     Stay hydrated drink plenty of fluids Take Tylenol or Motrin This will be sent off for results of any abnormal labs we will contact you check your MyChart for these in the next 24 to 48 hours If symptoms become worse you will need to follow-up in the ER ultrasound or CT scan Patient is requesting a flu test for body aches and headaches back Flu test is negative  Final Clinical Impressions(s) / UC Diagnoses   Final diagnoses:  Lower abdominal pain  Vaginal discharge  Body aches  Nonintractable headache, unspecified chronicity pattern, unspecified headache type     Discharge Instructions      Stay hydrated drink plenty of fluids Take Tylenol or Motrin This will be sent off for results of any abnormal labs we will contact you check your MyChart for these in the next 24 to 48 hours If symptoms become worse you will need to follow-up in the ER ultrasound or CT scan Patient can take Benadryl 25 mg every 12 hours as needed Your flu test was negative Stay hydrated drink plenty of fluids Take Tylenol as needed       ED Prescriptions     Medication Sig Dispense Auth. Provider   metroNIDAZOLE (FLAGYL) 500 MG tablet Take 1 tablet (500 mg total) by mouth 2 (two) times daily. 14 tablet Coralyn Mark, NP      PDMP not reviewed this encounter.   Coralyn Mark, NP 08/28/23 [phone]

## 2023-08-28 NOTE — ED Triage Notes (Signed)
PT reports she has a fever,ABD pain and a HA . Pt went to [facility] this morning fro pregnancy test ,which was Neg. Pt was sent to [facility] for eval of Sx's.

## 2023-08-28 NOTE — Discharge Instructions (Addendum)
Stay hydrated drink plenty of fluids Take Tylenol or Motrin This will be sent off for results of any abnormal labs we will contact you check your MyChart for these in the next 24 to 48 hours If symptoms become worse you will need to follow-up in the ER ultrasound or CT scan Patient can take Benadryl 25 mg every 12 hours as needed Your flu test was negative Stay hydrated drink plenty of fluids Take Tylenol as needed

## 2023-08-28 NOTE — MAU Note (Signed)
.  Melissa Farley is a 23 y.o. at Unknown here in MAU reporting: 8 days late for period - reports stopping birth control December 31st. Reports breast are sensitive. Also having fever >101, stuffy nose, and migraines that are unrelieved with tylenol - denies current HA. Has had negative home pregnancy tests. Denies abdominal pain, VB, or abnormal discharge.   LMP: 07/24/2023  Pain score: 0 Vitals:   08/28/23 0656  BP: (!) 93/50  Pulse: 98  Resp: 17  Temp: 100.2 F (37.9 C)  SpO2: 99%     FHT: NA   Lab orders placed from triage: urine pregnancy.

## 2023-08-28 NOTE — MAU Provider Note (Signed)
Event Date/Time   First Provider Initiated Contact with Patient 08/28/23 0701      S Ms. KIWANA DEBLASI is a 23 y.o. [phone] patient who presents to MAU today with complaint of late menses.  Also c/o other somatic symptoms such as signs of viral URI.   O BP (!) 93/50 (BP Location: Left Arm)   Pulse 98   Temp 100.2 F (37.9 C) (Oral)   Resp 17   LMP 07/15/2023 Comment: has been having lots of vaginal bleeding in january  SpO2 99%  Physical Exam Constitutional:      General: She is not in acute distress.    Appearance: Normal appearance. She is not ill-appearing or toxic-appearing.  Pulmonary:     Effort: Pulmonary effort is normal.  Neurological:     Mental Status: She is alert.     A Medical screening exam complete Negative pregnancy test  P Discharge from MAU in stable condition Patient given the option of transfer to [facility] [place] for further evaluation or seek care in outpatient facility of choice  List of options for follow-up given  Warning signs for worsening condition that would warrant emergency follow-up discussed Patient may return to MAU as needed   Aviva Signs, CNM 08/28/2023 7:01 AM

## 2023-08-29 LAB — CERVICOVAGINAL ANCILLARY ONLY
Bacterial Vaginitis (gardnerella): NEGATIVE
Candida Glabrata: NEGATIVE
Candida Vaginitis: NEGATIVE
Chlamydia: NEGATIVE
Comment: NEGATIVE
Comment: NEGATIVE
Comment: NEGATIVE
Comment: NEGATIVE
Comment: NEGATIVE
Comment: NORMAL
Neisseria Gonorrhea: NEGATIVE
Trichomonas: NEGATIVE

## 2023-08-30 ENCOUNTER — Emergency Department (HOSPITAL_COMMUNITY)
Admission: EM | Admit: 2023-08-30 | Discharge: 2023-08-31 | Disposition: A | Discharge status: Home / Self Care | Payer: Medicaid Other | Attending: Emergency Medicine | Admitting: Emergency Medicine

## 2023-08-30 ENCOUNTER — Emergency Department (HOSPITAL_COMMUNITY): Payer: Medicaid Other

## 2023-08-30 ENCOUNTER — Other Ambulatory Visit: Payer: Self-pay

## 2023-08-30 ENCOUNTER — Encounter (HOSPITAL_COMMUNITY): Payer: Self-pay | Admitting: Emergency Medicine

## 2023-08-30 DIAGNOSIS — R630 Anorexia: Secondary | ICD-10-CM | POA: Insufficient documentation

## 2023-08-30 DIAGNOSIS — R7401 Elevation of levels of liver transaminase levels: Secondary | ICD-10-CM | POA: Insufficient documentation

## 2023-08-30 DIAGNOSIS — D696 Thrombocytopenia, unspecified: Secondary | ICD-10-CM | POA: Diagnosis not present

## 2023-08-30 DIAGNOSIS — R1032 Left lower quadrant pain: Secondary | ICD-10-CM | POA: Diagnosis not present

## 2023-08-30 DIAGNOSIS — R509 Fever, unspecified: Secondary | ICD-10-CM | POA: Diagnosis not present

## 2023-08-30 DIAGNOSIS — N189 Chronic kidney disease, unspecified: Secondary | ICD-10-CM | POA: Diagnosis not present

## 2023-08-30 DIAGNOSIS — J4520 Mild intermittent asthma, uncomplicated: Secondary | ICD-10-CM | POA: Insufficient documentation

## 2023-08-30 DIAGNOSIS — R059 Cough, unspecified: Secondary | ICD-10-CM | POA: Diagnosis not present

## 2023-08-30 DIAGNOSIS — K828 Other specified diseases of gallbladder: Secondary | ICD-10-CM | POA: Diagnosis not present

## 2023-08-30 DIAGNOSIS — R9431 Abnormal electrocardiogram [ECG] [EKG]: Secondary | ICD-10-CM | POA: Diagnosis not present

## 2023-08-30 DIAGNOSIS — N898 Other specified noninflammatory disorders of vagina: Secondary | ICD-10-CM | POA: Diagnosis not present

## 2023-08-30 DIAGNOSIS — K810 Acute cholecystitis: Secondary | ICD-10-CM | POA: Diagnosis not present

## 2023-08-30 LAB — URINALYSIS, ROUTINE W REFLEX MICROSCOPIC
Bilirubin Urine: NEGATIVE
Glucose, UA: NEGATIVE mg/dL
Ketones, ur: NEGATIVE mg/dL
Leukocytes,Ua: NEGATIVE
Nitrite: NEGATIVE
Protein, ur: NEGATIVE mg/dL
Specific Gravity, Urine: 1.02 (ref 1.005–1.030)
pH: 5 (ref 5.0–8.0)

## 2023-08-30 LAB — COMPREHENSIVE METABOLIC PANEL
ALT: 78 U/L — ABNORMAL HIGH (ref 0–44)
AST: 109 U/L — ABNORMAL HIGH (ref 15–41)
Albumin: 3.4 g/dL — ABNORMAL LOW (ref 3.5–5.0)
Alkaline Phosphatase: 130 U/L — ABNORMAL HIGH (ref 38–126)
Anion gap: 11 (ref 5–15)
BUN: 8 mg/dL (ref 6–20)
CO2: 21 mmol/L — ABNORMAL LOW (ref 22–32)
Calcium: 9.4 mg/dL (ref 8.9–10.3)
Chloride: 106 mmol/L (ref 98–111)
Creatinine, Ser: 0.79 mg/dL (ref 0.44–1.00)
GFR, Estimated: >60 mL/min (ref >60)
Glucose, Bld: 114 mg/dL — ABNORMAL HIGH (ref 70–99)
Potassium: 4.4 mmol/L (ref 3.5–5.1)
Sodium: 138 mmol/L (ref 135–145)
Total Bilirubin: 0.9 mg/dL (ref 0.0–1.2)
Total Protein: 7.3 g/dL (ref 6.5–8.1)

## 2023-08-30 LAB — CBC
HCT: 36.9 % (ref 36.0–46.0)
Hemoglobin: 11.3 g/dL — ABNORMAL LOW (ref 12.0–15.0)
MCH: 25.6 pg — ABNORMAL LOW (ref 26.0–34.0)
MCHC: 30.6 g/dL (ref 30.0–36.0)
MCV: 83.5 fL (ref 80.0–100.0)
Platelets: 87 10*3/uL — ABNORMAL LOW (ref 150–400)
RBC: 4.42 MIL/uL (ref 3.87–5.11)
RDW: 13.9 % (ref 11.5–15.5)
WBC: 8 10*3/uL (ref 4.0–10.5)
nRBC: 0 % (ref 0.0–0.2)

## 2023-08-30 LAB — LIPASE, BLOOD: Lipase: 33 U/L (ref 11–51)

## 2023-08-30 LAB — ACETAMINOPHEN LEVEL: Acetaminophen (Tylenol), Serum: <10 ug/mL — ABNORMAL LOW (ref 10–30)

## 2023-08-30 LAB — HCG, QUANTITATIVE, PREGNANCY: hCG, Beta Chain, Quant, S: <1 m[IU]/mL (ref <5)

## 2023-08-30 MED ORDER — METRONIDAZOLE 500 MG/100ML IV SOLN
500.0000 mg | Freq: Two times a day (BID) | INTRAVENOUS | Status: DC
  Administered 2023-08-30: 500 mg via INTRAVENOUS
  Filled 2023-08-30: qty 100

## 2023-08-30 MED ORDER — SODIUM CHLORIDE 0.9 % IV SOLN
1.0000 g | Freq: Once | INTRAVENOUS | Status: AC
  Administered 2023-08-30: 1 g via INTRAVENOUS
  Filled 2023-08-30: qty 10

## 2023-08-30 MED ORDER — IOHEXOL 350 MG/ML SOLN
55.0000 mL | Freq: Once | INTRAVENOUS | Status: AC | PRN
  Administered 2023-08-30: 55 mL via INTRAVENOUS

## 2023-08-30 NOTE — ED Triage Notes (Signed)
Pt states she is continuously having fever since last Sunday with left side abd pain  radiating to left lower back pain. Pt states she is 11 days late on her period with negative pregnancy test on UC. Pt states she was negative for STD, UTI, covid and flue on UC. Pt states her PCP recently diagnose her with a stomach mass.

## 2023-08-30 NOTE — ED Provider Notes (Signed)
 [place] EMERGENCY DEPARTMENT AT [facility] Provider Note   CSN: [id number] Arrival date & time: 08/30/23  1934     History  Chief Complaint  Patient presents with   Fever   Abdominal Pain    Melissa Farley is a 23 y.o. female [id number] with PMH as listed below who presents with fever, abdominal pain.  Pt states she is continuously having fever since last Sunday up to 105 F with left side abd pain radiating to left lower back pain. Pt states she is 11 days late on her period with negative pregnancy test on UC. Pt states she was negative for STD, UTI, covid and flu on UC. Pt states her PCP recently diagnosed her with a stomach mass but only based on exam.  She endorses continued brown vaginal discharge without any vaginal symptoms but does feel like it is swollen. Denies sexual activity in the last year.  Denies any dysuria, hematuria but states that she was recently diagnosed with a UTI and treated.  Denies any diarrhea.  Endorses decreased p.o. intake, cough, and a near syncopal episode as well while at [facility].   Reported at urgent care on 08/28/23: [facility] today for a pregnancy test patient states that she is 9 days late for her menstrual cycle. Has lower abdominal pain vaginal discharge of white thick consistency. Patient is not currently taking any birth control. Intermittent fevers. Was treated for a UTI several weeks ago and symptoms have never fully resolved. Does have a history of ovarian cyst.   -Had negative wet prep and G/C tests on 2/13, also had negative influenza.    Past Medical History:  Diagnosis Date   Acute medial meniscus tear    ADHD (attention deficit hyperactivity disorder) 11/09/2012   Allergic rhinitis 11/09/2012   Allergy    Alopecia    Anxiety    Mom with severe anxiety, became addicted to pain meds   Asthma, mild intermittent 03/10/2016   Cardiac murmur 03/25/2013   Chronic kidney disease     Constipation    Fatigue    Frequent headaches    Poor eating habits    Scoliosis of thoracic spine 04/19/2014   UTI (urinary tract infection) 01/29/2022       Home Medications Prior to Admission medications   Medication Sig Start Date End Date Taking? Authorizing Provider  AUROVELA 24 FE 1-20 MG-MCG(24) tablet Take 1 tablet by mouth daily. Patient not taking: Reported on 07/24/2023 07/05/23   [provider]  ipratropium (ATROVENT) 0.03 % nasal spray Place 2 sprays into both nostrils every 12 (twelve) hours. Patient not taking: Reported on 07/24/2023 07/11/23   Raspet, Noberto Retort, PA-C  metroNIDAZOLE (FLAGYL) 500 MG tablet Take 1 tablet (500 mg total) by mouth 2 (two) times daily. 08/28/23   Coralyn Mark, NP  norethindrone (AYGESTIN) 5 MG tablet Take 1 tablet (5 mg total) by mouth daily. 07/25/23   Levie Heritage, DO  ondansetron (ZOFRAN-ODT) 4 MG disintegrating tablet Take 1 tablet (4 mg total) by mouth every 8 (eight) hours as needed. 08/07/23   Laurence Spates, MD      Allergies    Penicillins    Review of Systems   Review of Systems  Constitutional:  Fatigue: cxr.   A 10 point review of systems was performed and is negative unless otherwise reported in HPI.  Physical Exam Updated Vital Signs BP 117/84 (BP Location: Right Arm)   Pulse Marland Kitchen)  109   Temp 97.7 F (36.5 C)   Resp 15   Ht 5\' 9"  (1.753 m)   Wt 47.2 kg   LMP 07/24/2023 (Approximate) Comment: has been having lots of vaginal bleeding in january  SpO2 100%   BMI 15.37 kg/m  Physical Exam General: Normal appearing female, lying in bed.  HEENT: Sclera anicteric, MMM, trachea midline.  Cardiology: RRR, no murmurs/rubs/gallops.  Resp: Normal respiratory rate and effort. CTAB, no wheezes, rhonchi, crackles.  Abd: Soft, +LLQ TTP, no abdominal masses palpated, non-distended. No rebound tenderness or guarding.  MSK: No peripheral edema or signs of trauma. Extremities without deformity or TTP.  Skin: warm,  dry.  Back: No CVA tenderness Neuro: A&Ox4, CNs II-XII grossly intact. MAEs. Sensation grossly intact.  Psych: Normal mood and affect.   ED Results / Procedures / Treatments   Labs (all labs ordered are listed, but only abnormal results are displayed) Labs Reviewed  COMPREHENSIVE METABOLIC PANEL - Abnormal; Notable for the following components:      Result Value   CO2 21 (*)    Glucose, Bld 114 (*)    Albumin 3.4 (*)    AST 109 (*)    ALT 78 (*)    Alkaline Phosphatase 130 (*)    All other components within normal limits  CBC - Abnormal; Notable for the following components:   Hemoglobin 11.3 (*)    MCH 25.6 (*)    Platelets 87 (*)    All other components within normal limits  URINALYSIS, ROUTINE W REFLEX MICROSCOPIC - Abnormal; Notable for the following components:   Color, Urine AMBER (*)    Hgb urine dipstick SMALL (*)    Bacteria, UA RARE (*)    All other components within normal limits  ACETAMINOPHEN LEVEL - Abnormal; Notable for the following components:   Acetaminophen (Tylenol), Serum <10 (*)    All other components within normal limits  LIPASE, BLOOD  HCG, QUANTITATIVE, PREGNANCY    EKG None  Radiology No results found.  Procedures Procedures    Medications Ordered in ED Medications  iohexol (OMNIPAQUE) 350 MG/ML injection 55 mL (55 mLs Intravenous Contrast Given 08/30/23 2203)  cefTRIAXone (ROCEPHIN) 1 g in sodium chloride 0.9 % 100 mL IVPB (0 g Intravenous Stopped 08/30/23 2344)    ED Course/ Medical Decision Making/ A&P                          Medical Decision Making Amount and/or Complexity of Data Reviewed Labs: ordered. Decision-making details documented in ED Course. Radiology: ordered. Decision-making details documented in ED Course.  Risk Prescription drug management.    This patient presents to the ED for concern of abd pain, fever, this involves an extensive number of treatment options, and is a complaint that carries with it a high  risk of complications and morbidity.  I considered the following differential and admission for this acute, potentially life threatening condition.   MDM:    For DDX for abdominal pain includes but is not limited to:  Abdominal exam without peritoneal signs. No evidence of acute abdomen at this time. Consider acute hepatobiliary disease (including acute cholecystitis or cholangitis), acute pancreatitis (though neg lipase), PUD (including gastric perforation), acute appendicitis, diverticulitis. W/ LLQ pain and fever also considered TOA/PID, septic abortion, though patient reassuringly has neg pregnancy test and no vaginal sxs. Sxs aren't fast on fast off to indicate ovarian torsion.    Clinical Course as of 08/31/23 1511  Sat Aug 30, 2023  2140 Alkaline Phosphatase(!): 130 [HN]  2140 ALT(!): 78 [HN]  2140 AST(!): 109 Newly elevated LFTs [HN]  2140 Ca Oxalate Crys, UA: PRESENT +calcium oxalate crystals with some Hgb but no RBCs, consider possible renal stone [HN]  2140 HCG, Beta Chain, Quant, S: <1 neg [HN]  2140 Lipase: 33 neg [HN]  2140 WBC: 8.0 No leukocytosis  [HN]  2140 Platelets(!): 87 +thrombocytopenia [HN]  2143 Creatinine: 0.79 Renal function okay [HN]  2245 CT ABDOMEN PELVIS W CONTRAST Pericholecystic fluid/wall thickening suspicious for acute cholecystitis. Ultrasound may be helpful for further evaluation. These changes are new from the most recent exam.  Mild periportal edema which may be related to inflammatory change.  Free fluid within the pelvis likely physiologic in nature.   [HN]  2246 Allergic to penicillins, but has tolerated cephalosporins in the past. Will give IV ceftriaxone and metronidazole. Will consult to surgery. [HN]    Clinical Course User Index [HN] Loetta Rough, MD    Labs: I Ordered, and personally interpreted labs.  The pertinent results include:  those listed above  Imaging Studies ordered: I ordered imaging studies including CT abd  pelvis w contrast I independently visualized and interpreted imaging. I agree with the radiologist interpretation  Additional history obtained from chart review, friend at bedside.    Cardiac Monitoring: The patient was maintained on a cardiac monitor.  I personally viewed and interpreted the cardiac monitored which showed an underlying rhythm of: NSR  Reevaluation: After the interventions noted above, I reevaluated the patient and found that they have :improved  Social Determinants of Health: Lives independently  Disposition:  Admitted to surgery  Co morbidities that complicate the patient evaluation  Past Medical History:  Diagnosis Date   Acute medial meniscus tear    ADHD (attention deficit hyperactivity disorder) 11/09/2012   Allergic rhinitis 11/09/2012   Allergy    Alopecia    Anxiety    Mom with severe anxiety, became addicted to pain meds   Asthma, mild intermittent 03/10/2016   Cardiac murmur 03/25/2013   Chronic kidney disease    Constipation    Fatigue    Frequent headaches    Poor eating habits    Scoliosis of thoracic spine 04/19/2014   UTI (urinary tract infection) 01/29/2022     Medicines No orders of the defined types were placed in this encounter.   I have reviewed the patients home medicines and have made adjustments as needed  Problem List / ED Course: Problem List Items Addressed This Visit   None Visit Diagnoses       Cholecystitis, acute    -  Primary                   This note was created using dictation software, which may contain spelling or grammatical errors.    Loetta Rough, MD 09/06/23 [phone]

## 2023-08-31 DIAGNOSIS — K828 Other specified diseases of gallbladder: Secondary | ICD-10-CM | POA: Diagnosis not present

## 2023-08-31 NOTE — ED Provider Notes (Signed)
General surgery has seen the patient and recommended discharge with outpatient follow-up for an elective laparoscopic cholecystectomy.  Patient's vital signs are reassuring without fever or tachycardia.  Lab work reviewed showing no leukocytosis, CT scan reviewed, patient agreeable to the plan of discharge and outpatient follow-up   Eber Hong, MD 08/31/23 [phone]

## 2023-08-31 NOTE — H&P (Signed)
,   Reason for Consult/Chief Complaint: cholecystitis Consultant: Jearld Fenton, MD  Melissa Farley is an 23 y.o. female.   HPI: 26F with one day onset of RUQ abdominal pain. Denies associated n/v, but reports CP at the time of symptom onset. Fever "to 105". Normal BM 2/15. No prior abdominal surgery.   Past Medical History:  Diagnosis Date   Acute medial meniscus tear    ADHD (attention deficit hyperactivity disorder) 11/09/2012   Allergic rhinitis 11/09/2012   Allergy    Alopecia    Anxiety    Mom with severe anxiety, became addicted to pain meds   Asthma, mild intermittent 03/10/2016   Cardiac murmur 03/25/2013   Chronic kidney disease    Constipation    Fatigue    Frequent headaches    Poor eating habits    Scoliosis of thoracic spine 04/19/2014   UTI (urinary tract infection) 01/29/2022    Past Surgical History:  Procedure Laterality Date   NO PAST SURGERIES      Family History  Problem Relation Age of Onset   Miscarriages / [place] Mother    Cancer Mother    Drug abuse Mother    Stroke Father    Asthma Father    Drug abuse Father    Asthma Sister    Behavior problems Sister    Asthma Maternal Aunt    Heart disease Maternal Uncle    HIV Paternal Uncle    Diabetes Maternal Grandfather    Hypertension Maternal Grandfather    Alcohol abuse Other    Birth defects Neg Hx     Social History:  reports that she has quit smoking. Her smoking use included e-cigarettes. She has never used smokeless tobacco. She reports that she does not drink alcohol and does not use drugs.  Allergies:  Allergies  Allergen Reactions   Percocet [Oxycodone-Acetaminophen] Nausea And Vomiting    Nightmares    Penicillins Rash    Medications: I have reviewed the patient's current medications.  Results for orders placed or performed during the hospital encounter of 08/30/23 (from the past 48 hours)  Lipase, blood     Status: None   Collection Time: 08/30/23  7:46 PM  Result  Value Ref Range   Lipase 33 11 - 51 U/L    Comment: Performed at [facility], [address] [place], [place] [address]  Comprehensive metabolic panel     Status: Abnormal   Collection Time: 08/30/23  7:46 PM  Result Value Ref Range   Sodium 138 135 - 145 mmol/L   Potassium 4.4 3.5 - 5.1 mmol/L   Chloride 106 98 - 111 mmol/L   CO2 21 (L) 22 - 32 mmol/L   Glucose, Bld 114 (H) 70 - 99 mg/dL    Comment: Glucose reference range applies only to samples taken after fasting for at least 8 hours.   BUN 8 6 - 20 mg/dL   Creatinine, Ser 1.02 0.44 - 1.00 mg/dL   Calcium 9.4 8.9 - 72.5 mg/dL   Total Protein 7.3 6.5 - 8.1 g/dL   Albumin 3.4 (L) 3.5 - 5.0 g/dL   AST 366 (H) 15 - 41 U/L   ALT 78 (H) 0 - 44 U/L   Alkaline Phosphatase 130 (H) 38 - 126 U/L   Total Bilirubin 0.9 0.0 - 1.2 mg/dL   GFR, Estimated >44 >03 mL/min    Comment: (NOTE) Calculated using the CKD-EPI Creatinine Equation (2021)    Anion gap 11 5 -  15    Comment: Performed at [facility], [address] [place], [place] [address]  CBC     Status: Abnormal   Collection Time: 08/30/23  7:46 PM  Result Value Ref Range   WBC 8.0 4.0 - 10.5 K/uL   RBC 4.42 3.87 - 5.11 MIL/uL   Hemoglobin 11.3 (L) 12.0 - 15.0 g/dL   HCT 78.4 69.6 - 29.5 %   MCV 83.5 80.0 - 100.0 fL   MCH 25.6 (L) 26.0 - 34.0 pg   MCHC 30.6 30.0 - 36.0 g/dL   RDW 28.4 13.2 - 44.0 %   Platelets 87 (L) 150 - 400 K/uL    Comment: Immature Platelet Fraction may be clinically indicated, consider ordering this additional test [id number] REPEATED TO VERIFY    nRBC 0.0 0.0 - 0.2 %    Comment: Performed at [facility], [address], [place], [place] [address]  Urinalysis, Routine w reflex microscopic -Urine, Clean Catch     Status: Abnormal   Collection Time: 08/30/23  7:55 PM  Result Value Ref Range   Color, Urine AMBER (A) YELLOW    Comment: BIOCHEMICALS MAY BE AFFECTED BY COLOR   APPearance CLEAR CLEAR   Specific Gravity, Urine 1.020  1.005 - 1.030   pH 5.0 5.0 - 8.0   Glucose, UA NEGATIVE NEGATIVE mg/dL   Hgb urine dipstick SMALL (A) NEGATIVE   Bilirubin Urine NEGATIVE NEGATIVE   Ketones, ur NEGATIVE NEGATIVE mg/dL   Protein, ur NEGATIVE NEGATIVE mg/dL   Nitrite NEGATIVE NEGATIVE   Leukocytes,Ua NEGATIVE NEGATIVE   RBC / HPF 0-5 0 - 5 RBC/hpf   WBC, UA 0-5 0 - 5 WBC/hpf   Bacteria, UA RARE (A) NONE SEEN   Squamous Epithelial / HPF 0-5 0 - 5 /HPF   Mucus PRESENT    Ca Oxalate Crys, UA PRESENT     Comment: Performed at [facility], [address], [place], [place] [address]  hCG, quantitative, pregnancy     Status: None   Collection Time: 08/30/23  8:30 PM  Result Value Ref Range   hCG, Beta Chain, Quant, S <1 <5 mIU/mL    Comment:          GEST. AGE      CONC.  (mIU/mL)   <=1 WEEK        5 - 50     2 WEEKS       50 - 500     3 WEEKS       100 - 10,000     4 WEEKS     1,000 - 30,000     5 WEEKS     3,500 - 115,000   6-8 WEEKS     12,000 - 270,000    12 WEEKS     15,000 - 220,000        FEMALE AND NON-PREGNANT FEMALE:     LESS THAN 5 mIU/mL Performed at [facility], [address] [place], [place] [address]   Acetaminophen level     Status: Abnormal   Collection Time: 08/30/23  9:49 PM  Result Value Ref Range   Acetaminophen (Tylenol), Serum <10 (L) 10 - 30 ug/mL    Comment: (NOTE) Therapeutic concentrations vary significantly. A range of 10-30 ug/mL  may be an effective concentration for many patients. However, some  are best treated at concentrations outside of this range. Acetaminophen concentrations >150 ug/mL at 4 hours after ingestion  and >50 ug/mL at 12 hours after ingestion are often associated with  toxic reactions.  Performed at [facility], [address], [place], [place] [address]     CT ABDOMEN PELVIS W CONTRAST Result Date: 08/30/2023 CLINICAL DATA:  Left lower quadrant pain and fevers EXAM: CT ABDOMEN AND PELVIS WITH CONTRAST TECHNIQUE: Multidetector CT imaging  of the abdomen and pelvis was performed using the standard protocol following bolus administration of intravenous contrast. RADIATION DOSE REDUCTION: This exam was performed according to the departmental dose-optimization program which includes automated exposure control, adjustment of the mA and/or kV according to patient size and/or use of iterative reconstruction technique. CONTRAST:  55mL OMNIPAQUE IOHEXOL 350 MG/ML SOLN COMPARISON:  08/07/2023 FINDINGS: Lower chest: No acute abnormality. Hepatobiliary: Liver shows mild periportal edema which may be related to underlying inflammatory change. No focal mass is noted. Gallbladder demonstrates wall thickening/pericholecystic fluid. This may represent acute cholecystitis. Ultrasound is recommended for further evaluation. Pancreas: Unremarkable. No pancreatic ductal dilatation or surrounding inflammatory changes. Spleen: Spleen is mildly prominent without focal mass. Adrenals/Urinary Tract: Adrenal glands are within normal limits. Kidneys demonstrate a normal enhancement pattern bilaterally. No renal calculi or obstructive changes are seen. The bladder is decompressed. Stomach/Bowel: The appendix is air-filled and within normal limits. No obstructive or inflammatory changes of the colon are seen. Stomach and small bowel are within normal limits. Vascular/Lymphatic: No vascular abnormality is noted. Scattered mesenteric lymph nodes are seen slightly more prominent than that noted on the prior exam. This may represent some mild mesenteric adenitis. Reproductive: Uterus and bilateral adnexa are unremarkable. Other: Mild free fluid is noted within the pelvis. This may be physiologic in nature. Musculoskeletal: No acute bony abnormality is noted. IMPRESSION: Pericholecystic fluid/wall thickening suspicious for acute cholecystitis. Ultrasound may be helpful for further evaluation. These changes are new from the most recent exam. Mild periportal edema which may be related to  inflammatory change. Free fluid within the pelvis likely physiologic in nature. Electronically Signed   By: Alcide Clever M.D.   On: 08/30/2023 22:24   DG Chest Portable 1 View Result Date: 08/30/2023 CLINICAL DATA:  cough, fever EXAM: PORTABLE CHEST - 1 VIEW COMPARISON:  08/02/2013. FINDINGS: Cardiac silhouette is unremarkable. No pneumothorax or pleural effusion. The lungs are clear. The visualized skeletal structures are unremarkable. IMPRESSION: No acute cardiopulmonary process. Electronically Signed   By: Layla Maw M.D.   On: 08/30/2023 21:29    ROS 10 point review of systems is negative except as listed above in HPI.   Physical Exam Blood pressure 90/76, pulse 67, temperature 98 F (36.7 C), temperature source Oral, resp. rate 16, height 5\' 9"  (1.753 m), weight 47.2 kg, last menstrual period 07/24/2023, SpO2 98%, not currently breastfeeding. Constitutional: well-developed, well-nourished HEENT: pupils equal, round, reactive to light, 2mm b/l, moist conjunctiva, external inspection of ears and nose normal, hearing intact Oropharynx: normal oropharyngeal mucosa, normal dentition Neck: no thyromegaly, trachea midline, no midline cervical tenderness to palpation Chest: breath sounds equal bilaterally, normal respiratory effort, no midline or lateral chest wall tenderness to palpation/deformity Abdomen: soft, NT, no bruising, no hepatosplenomegaly GU: normal female genitalia  Back: no wounds, no thoracic/lumbar spine tenderness to palpation, no thoracic/lumbar spine stepoffs Skin: warm, dry, no rashes Psych: normal memory, normal mood/affect     Assessment/Plan: Biliary dyskinesia - recommend elective lap chole. AF, normal WBC, okay for discharge with o/p f/u FEN - regular diet Dispo -  discharge, will have my office schedule an appointment for her  Diamantina Monks, MD General and Trauma Surgery [facility]

## 2023-08-31 NOTE — Discharge Instructions (Addendum)
The general surgeon has seen you and is requested that they see you in the office, they think that they can take this out as an outpatient, you do not need to be admitted to the hospital today.  Return for severe worsening symptoms including fevers abdominal pain or vomiting

## 2023-09-03 ENCOUNTER — Emergency Department (HOSPITAL_COMMUNITY): Payer: Medicaid Other

## 2023-09-03 ENCOUNTER — Other Ambulatory Visit: Payer: Self-pay

## 2023-09-03 ENCOUNTER — Inpatient Hospital Stay (HOSPITAL_COMMUNITY)
Admission: EM | Admit: 2023-09-03 | Discharge: 2023-09-06 | DRG: 392 | Disposition: A | Discharge status: Home / Self Care | Payer: Medicaid Other | Attending: General Surgery | Admitting: General Surgery

## 2023-09-03 ENCOUNTER — Encounter (HOSPITAL_COMMUNITY): Payer: Self-pay | Admitting: General Surgery

## 2023-09-03 DIAGNOSIS — N920 Excessive and frequent menstruation with regular cycle: Secondary | ICD-10-CM | POA: Diagnosis present

## 2023-09-03 DIAGNOSIS — R109 Unspecified abdominal pain: Secondary | ICD-10-CM | POA: Diagnosis present

## 2023-09-03 DIAGNOSIS — Q970 Karyotype 47, XXX: Secondary | ICD-10-CM

## 2023-09-03 DIAGNOSIS — R1011 Right upper quadrant pain: Secondary | ICD-10-CM | POA: Diagnosis not present

## 2023-09-03 DIAGNOSIS — Z825 Family history of asthma and other chronic lower respiratory diseases: Secondary | ICD-10-CM

## 2023-09-03 DIAGNOSIS — D500 Iron deficiency anemia secondary to blood loss (chronic): Secondary | ICD-10-CM | POA: Diagnosis present

## 2023-09-03 DIAGNOSIS — R5381 Other malaise: Secondary | ICD-10-CM | POA: Diagnosis not present

## 2023-09-03 DIAGNOSIS — J101 Influenza due to other identified influenza virus with other respiratory manifestations: Secondary | ICD-10-CM | POA: Diagnosis present

## 2023-09-03 DIAGNOSIS — R110 Nausea: Secondary | ICD-10-CM | POA: Diagnosis not present

## 2023-09-03 DIAGNOSIS — Z885 Allergy status to narcotic agent status: Secondary | ICD-10-CM

## 2023-09-03 DIAGNOSIS — K828 Other specified diseases of gallbladder: Secondary | ICD-10-CM | POA: Diagnosis present

## 2023-09-03 DIAGNOSIS — R079 Chest pain, unspecified: Secondary | ICD-10-CM | POA: Diagnosis not present

## 2023-09-03 DIAGNOSIS — K581 Irritable bowel syndrome with constipation: Secondary | ICD-10-CM | POA: Diagnosis present

## 2023-09-03 DIAGNOSIS — R1310 Dysphagia, unspecified: Secondary | ICD-10-CM

## 2023-09-03 DIAGNOSIS — F419 Anxiety disorder, unspecified: Secondary | ICD-10-CM | POA: Diagnosis present

## 2023-09-03 DIAGNOSIS — R0789 Other chest pain: Secondary | ICD-10-CM | POA: Diagnosis not present

## 2023-09-03 DIAGNOSIS — Z803 Family history of malignant neoplasm of breast: Secondary | ICD-10-CM

## 2023-09-03 DIAGNOSIS — Z8249 Family history of ischemic heart disease and other diseases of the circulatory system: Secondary | ICD-10-CM

## 2023-09-03 DIAGNOSIS — K810 Acute cholecystitis: Principal | ICD-10-CM

## 2023-09-03 DIAGNOSIS — J4520 Mild intermittent asthma, uncomplicated: Secondary | ICD-10-CM | POA: Diagnosis present

## 2023-09-03 DIAGNOSIS — R933 Abnormal findings on diagnostic imaging of other parts of digestive tract: Secondary | ICD-10-CM | POA: Diagnosis not present

## 2023-09-03 DIAGNOSIS — Z833 Family history of diabetes mellitus: Secondary | ICD-10-CM

## 2023-09-03 DIAGNOSIS — K2970 Gastritis, unspecified, without bleeding: Principal | ICD-10-CM | POA: Diagnosis present

## 2023-09-03 DIAGNOSIS — M419 Scoliosis, unspecified: Secondary | ICD-10-CM | POA: Diagnosis present

## 2023-09-03 DIAGNOSIS — Z880 Allergy status to penicillin: Secondary | ICD-10-CM

## 2023-09-03 DIAGNOSIS — R7989 Other specified abnormal findings of blood chemistry: Secondary | ICD-10-CM

## 2023-09-03 DIAGNOSIS — Z87440 Personal history of urinary (tract) infections: Secondary | ICD-10-CM

## 2023-09-03 DIAGNOSIS — Z823 Family history of stroke: Secondary | ICD-10-CM

## 2023-09-03 DIAGNOSIS — K219 Gastro-esophageal reflux disease without esophagitis: Secondary | ICD-10-CM | POA: Diagnosis present

## 2023-09-03 DIAGNOSIS — Z87891 Personal history of nicotine dependence: Secondary | ICD-10-CM

## 2023-09-03 DIAGNOSIS — F909 Attention-deficit hyperactivity disorder, unspecified type: Secondary | ICD-10-CM | POA: Diagnosis present

## 2023-09-03 DIAGNOSIS — R54 Age-related physical debility: Secondary | ICD-10-CM | POA: Diagnosis present

## 2023-09-03 DIAGNOSIS — D696 Thrombocytopenia, unspecified: Secondary | ICD-10-CM | POA: Diagnosis present

## 2023-09-03 DIAGNOSIS — Z1152 Encounter for screening for COVID-19: Secondary | ICD-10-CM

## 2023-09-03 DIAGNOSIS — D649 Anemia, unspecified: Secondary | ICD-10-CM

## 2023-09-03 LAB — CBC
HCT: 28.3 % — ABNORMAL LOW (ref 36.0–46.0)
HCT: 32.5 % — ABNORMAL LOW (ref 36.0–46.0)
Hemoglobin: 10 g/dL — ABNORMAL LOW (ref 12.0–15.0)
Hemoglobin: 8.6 g/dL — ABNORMAL LOW (ref 12.0–15.0)
MCH: 25.6 pg — ABNORMAL LOW (ref 26.0–34.0)
MCH: 25.8 pg — ABNORMAL LOW (ref 26.0–34.0)
MCHC: 30.4 g/dL (ref 30.0–36.0)
MCHC: 30.8 g/dL (ref 30.0–36.0)
MCV: 84 fL (ref 80.0–100.0)
MCV: 84.2 fL (ref 80.0–100.0)
Platelets: 111 10*3/uL — ABNORMAL LOW (ref 150–400)
Platelets: 115 10*3/uL — ABNORMAL LOW (ref 150–400)
RBC: 3.36 MIL/uL — ABNORMAL LOW (ref 3.87–5.11)
RBC: 3.87 MIL/uL (ref 3.87–5.11)
RDW: 14.3 % (ref 11.5–15.5)
RDW: 14.6 % (ref 11.5–15.5)
WBC: 7 10*3/uL (ref 4.0–10.5)
WBC: 8.3 10*3/uL (ref 4.0–10.5)
nRBC: 0 % (ref 0.0–0.2)
nRBC: 0 % (ref 0.0–0.2)

## 2023-09-03 LAB — COMPREHENSIVE METABOLIC PANEL
ALT: 70 U/L — ABNORMAL HIGH (ref 0–44)
AST: 95 U/L — ABNORMAL HIGH (ref 15–41)
Albumin: 3.1 g/dL — ABNORMAL LOW (ref 3.5–5.0)
Alkaline Phosphatase: 123 U/L (ref 38–126)
Anion gap: 9 (ref 5–15)
BUN: 9 mg/dL (ref 6–20)
CO2: 25 mmol/L (ref 22–32)
Calcium: 9 mg/dL (ref 8.9–10.3)
Chloride: 105 mmol/L (ref 98–111)
Creatinine, Ser: 0.63 mg/dL (ref 0.44–1.00)
GFR, Estimated: >60 mL/min (ref >60)
Glucose, Bld: 100 mg/dL — ABNORMAL HIGH (ref 70–99)
Potassium: 4.1 mmol/L (ref 3.5–5.1)
Sodium: 139 mmol/L (ref 135–145)
Total Bilirubin: 0.4 mg/dL (ref 0.0–1.2)
Total Protein: 6.5 g/dL (ref 6.5–8.1)

## 2023-09-03 LAB — HIV ANTIBODY (ROUTINE TESTING W REFLEX): HIV Screen 4th Generation wRfx: NONREACTIVE

## 2023-09-03 LAB — URINALYSIS, ROUTINE W REFLEX MICROSCOPIC
Bilirubin Urine: NEGATIVE
Glucose, UA: NEGATIVE mg/dL
Hgb urine dipstick: NEGATIVE
Ketones, ur: NEGATIVE mg/dL
Leukocytes,Ua: NEGATIVE
Nitrite: NEGATIVE
Protein, ur: NEGATIVE mg/dL
Specific Gravity, Urine: 1.025 (ref 1.005–1.030)
pH: 5 (ref 5.0–8.0)

## 2023-09-03 LAB — RESP PANEL BY RT-PCR (RSV, FLU A&B, COVID)  RVPGX2
Influenza A by PCR: POSITIVE — AB
Influenza B by PCR: NEGATIVE
Resp Syncytial Virus by PCR: NEGATIVE
SARS Coronavirus 2 by RT PCR: NEGATIVE

## 2023-09-03 LAB — HCG, SERUM, QUALITATIVE: Preg, Serum: NEGATIVE

## 2023-09-03 LAB — LIPASE, BLOOD: Lipase: 31 U/L (ref 11–51)

## 2023-09-03 LAB — CREATININE, SERUM
Creatinine, Ser: 0.63 mg/dL (ref 0.44–1.00)
GFR, Estimated: >60 mL/min (ref >60)

## 2023-09-03 LAB — TROPONIN I (HIGH SENSITIVITY)
Troponin I (High Sensitivity): 3 ng/L (ref <18)
Troponin I (High Sensitivity): 3 ng/L (ref <18)

## 2023-09-03 MED ORDER — OXYCODONE HCL 5 MG PO TABS
5.0000 mg | ORAL_TABLET | ORAL | Status: DC | PRN
  Administered 2023-09-05 (×2): 5 mg via ORAL
  Filled 2023-09-03 (×4): qty 1

## 2023-09-03 MED ORDER — SODIUM CHLORIDE 0.9 % IV SOLN: Freq: Once | INTRAVENOUS | Status: AC

## 2023-09-03 MED ORDER — TRAMADOL HCL 50 MG PO TABS
50.0000 mg | ORAL_TABLET | Freq: Four times a day (QID) | ORAL | Status: DC | PRN
  Administered 2023-09-03 – 2023-09-06 (×3): 50 mg via ORAL
  Filled 2023-09-03 (×4): qty 1

## 2023-09-03 MED ORDER — OXYCODONE HCL 5 MG PO TABS: 10.0000 mg | ORAL_TABLET | ORAL | Status: DC | PRN

## 2023-09-03 MED ORDER — ENOXAPARIN SODIUM 40 MG/0.4ML IJ SOSY
40.0000 mg | PREFILLED_SYRINGE | INTRAMUSCULAR | Status: DC
  Administered 2023-09-03: 40 mg via SUBCUTANEOUS
  Filled 2023-09-03: qty 0.4

## 2023-09-03 MED ORDER — ACETAMINOPHEN 500 MG PO TABS
1000.0000 mg | ORAL_TABLET | Freq: Four times a day (QID) | ORAL | Status: DC
  Administered 2023-09-03 – 2023-09-06 (×7): 1000 mg via ORAL
  Filled 2023-09-03 (×8): qty 2

## 2023-09-03 MED ORDER — METRONIDAZOLE 500 MG/100ML IV SOLN
500.0000 mg | Freq: Once | INTRAVENOUS | Status: AC
  Administered 2023-09-03: 500 mg via INTRAVENOUS
  Filled 2023-09-03: qty 100

## 2023-09-03 MED ORDER — DIPHENHYDRAMINE HCL 50 MG/ML IJ SOLN: 25.0000 mg | Freq: Four times a day (QID) | INTRAMUSCULAR | Status: DC | PRN

## 2023-09-03 MED ORDER — DOCUSATE SODIUM 100 MG PO CAPS
100.0000 mg | ORAL_CAPSULE | Freq: Two times a day (BID) | ORAL | Status: DC
  Administered 2023-09-03 – 2023-09-06 (×6): 100 mg via ORAL
  Filled 2023-09-03 (×6): qty 1

## 2023-09-03 MED ORDER — FENTANYL CITRATE PF 50 MCG/ML IJ SOSY
25.0000 ug | PREFILLED_SYRINGE | Freq: Once | INTRAMUSCULAR | Status: AC
  Administered 2023-09-03: 25 ug via INTRAVENOUS
  Filled 2023-09-03: qty 1

## 2023-09-03 MED ORDER — HYDROMORPHONE HCL 1 MG/ML IJ SOLN
0.5000 mg | INTRAMUSCULAR | Status: DC | PRN
  Administered 2023-09-03 – 2023-09-04 (×3): 0.5 mg via INTRAVENOUS
  Filled 2023-09-03 (×2): qty 0.5
  Filled 2023-09-03: qty 1

## 2023-09-03 MED ORDER — ONDANSETRON 4 MG PO TBDP
4.0000 mg | ORAL_TABLET | Freq: Four times a day (QID) | ORAL | Status: DC | PRN
  Administered 2023-09-03: 4 mg via ORAL
  Filled 2023-09-03: qty 1

## 2023-09-03 MED ORDER — POLYETHYLENE GLYCOL 3350 17 G PO PACK: 17.0000 g | PACK | Freq: Every day | ORAL | Status: DC | PRN

## 2023-09-03 MED ORDER — SODIUM CHLORIDE 0.9 % IV SOLN
2.0000 g | INTRAVENOUS | Status: DC
  Administered 2023-09-03 – 2023-09-05 (×3): 2 g via INTRAVENOUS
  Filled 2023-09-03 (×3): qty 20

## 2023-09-03 MED ORDER — ONDANSETRON HCL 4 MG/2ML IJ SOLN
4.0000 mg | Freq: Four times a day (QID) | INTRAMUSCULAR | Status: DC | PRN
  Administered 2023-09-04 – 2023-09-05 (×2): 4 mg via INTRAVENOUS
  Filled 2023-09-03 (×2): qty 2

## 2023-09-03 MED ORDER — SODIUM CHLORIDE 0.9 % IV BOLUS
1000.0000 mL | Freq: Once | INTRAVENOUS | Status: AC
  Administered 2023-09-03: 1000 mL via INTRAVENOUS

## 2023-09-03 MED ORDER — METOPROLOL TARTRATE 5 MG/5ML IV SOLN: 5.0000 mg | Freq: Four times a day (QID) | INTRAVENOUS | Status: DC | PRN

## 2023-09-03 MED ORDER — SODIUM CHLORIDE 0.9 % IV SOLN
INTRAVENOUS | Status: AC
Start: 2023-09-03 — End: 2023-09-04

## 2023-09-03 MED ORDER — MELATONIN 3 MG PO TABS
3.0000 mg | ORAL_TABLET | Freq: Every evening | ORAL | Status: DC | PRN
  Administered 2023-09-05: 3 mg via ORAL
  Filled 2023-09-03: qty 1

## 2023-09-03 MED ORDER — METHOCARBAMOL 1000 MG/10ML IJ SOLN: 500.0000 mg | Freq: Three times a day (TID) | INTRAMUSCULAR | Status: DC | PRN

## 2023-09-03 MED ORDER — SIMETHICONE 80 MG PO CHEW: 40.0000 mg | CHEWABLE_TABLET | Freq: Four times a day (QID) | ORAL | Status: DC | PRN

## 2023-09-03 MED ORDER — METHOCARBAMOL 500 MG PO TABS: 500.0000 mg | ORAL_TABLET | Freq: Three times a day (TID) | ORAL | Status: DC | PRN

## 2023-09-03 MED ORDER — DIPHENHYDRAMINE HCL 25 MG PO CAPS: 25.0000 mg | ORAL_CAPSULE | Freq: Four times a day (QID) | ORAL | Status: DC | PRN

## 2023-09-03 NOTE — H&P (Signed)
History and Physical Exam Note  Melissa Farley Apr 08, 2001  [id number].    Requesting MD: Dr. Eloise Harman Chief Complaint/Reason for Consult: abdominal pain, possible cholecystitis  HPI:  23 y.o. female with medical history significant for ADHD, asthma, IBS -C, anxiety who presented to [facility] ED with abdominal pain. Pain initially began 2/9 located in RUQ and radiating into her back. When symptoms first started she had fever of 105F and emesis x3. She was seen in UC and then in the ED on 2/16 for her symptoms after she had lack of improvement. At that time general surgery Dr. Bedelia Person evaluated with suspicion for biliary dyskinesia and outpatient follow up and elective lap chole recommended. Since then, pain has not improved and she had acute worsening last night prompting presentation to ED. She does not remember any precipitating factors and does not recall what she ate if anything prior to the pain worsening. She has had reduced appetite and nausea since ED visit 2/16 but no emesis. At baseline she has IBS - constipation with last BM Saturday which was normal for her. She has had one sick contact with n/v but otherwise no sick contacts. She has had one other similar pain episode in January of this year. Otherwise she has noticed some urinary symptoms of urgency, frequency, and sensation of incomplete bladder emptying. She has noted blood on toilet paper when wiping after urination on one occasion and was not on her period at that time.  Substance use: vapes Allergies: percocet, PCN Blood thinners: none Past Surgeries: no prior abdominal surgeries   ROS: ROS reviewed and as above  Family History  Problem Relation Age of Onset   Miscarriages / Stillbirths Mother    Cancer Mother    Drug abuse Mother    Stroke Father    Asthma Father    Drug abuse Father    Asthma Sister    Behavior problems Sister    Asthma Maternal Aunt    Heart disease Maternal Uncle    HIV Paternal Uncle     Diabetes Maternal Grandfather    Hypertension Maternal Grandfather    Alcohol abuse Other    Birth defects Neg Hx     Past Medical History:  Diagnosis Date   Acute medial meniscus tear    ADHD (attention deficit hyperactivity disorder) 11/09/2012   Allergic rhinitis 11/09/2012   Allergy    Alopecia    Anxiety    Mom with severe anxiety, became addicted to pain meds   Asthma, mild intermittent 03/10/2016   Cardiac murmur 03/25/2013   Chronic kidney disease    Constipation    Fatigue    Frequent headaches    Poor eating habits    Scoliosis of thoracic spine 04/19/2014   UTI (urinary tract infection) 01/29/2022    Past Surgical History:  Procedure Laterality Date   NO PAST SURGERIES      Social History:  reports that she has quit smoking. Her smoking use included e-cigarettes. She has never used smokeless tobacco. She reports that she does not drink alcohol and does not use drugs.  Allergies:  Allergies  Allergen Reactions   Percocet [Oxycodone-Acetaminophen] Nausea And Vomiting    Nightmares    Penicillins Rash    (Not in a hospital admission)   Blood pressure (!) 84/54, pulse 73, temperature 97.8 F (36.6 C), temperature source Oral, resp. rate 14, last menstrual period 07/24/2023, SpO2 97%, not currently breastfeeding. Physical Exam: General: pleasant, WD, female who is  laying in bed in NAD HEENT: head is normocephalic, atraumatic.  Sclera are noninjected.  Pupils equal and round. EOMs intact.  Ears and nose without any masses or lesions.  Wearing a mask Heart: Palpable radial pulses bilaterally Lungs:  Respiratory effort nonlabored on room air Abd: soft, ND, no masses, hernias, or organomegaly. Mild to moderate TTP RUQ and right flank without rebound or guarding MSK: all 4 extremities are symmetrical with no cyanosis, clubbing, or edema. Skin: warm and dry with no masses, lesions, or rashes Neuro: Cranial nerves 2-12 grossly intact, sensation is normal  throughout Psych: A&Ox3 with an appropriate affect.    Results for orders placed or performed during the hospital encounter of 09/03/23 (from the past 48 hours)  Lipase, blood     Status: None   Collection Time: 09/03/23 12:24 AM  Result Value Ref Range   Lipase 31 11 - 51 U/L    Comment: Performed at [facility], [address] [place], [place] [address]  Comprehensive metabolic panel     Status: Abnormal   Collection Time: 09/03/23 12:24 AM  Result Value Ref Range   Sodium 139 135 - 145 mmol/L   Potassium 4.1 3.5 - 5.1 mmol/L   Chloride 105 98 - 111 mmol/L   CO2 25 22 - 32 mmol/L   Glucose, Bld 100 (H) 70 - 99 mg/dL    Comment: Glucose reference range applies only to samples taken after fasting for at least 8 hours.   BUN 9 6 - 20 mg/dL   Creatinine, Ser 8.11 0.44 - 1.00 mg/dL   Calcium 9.0 8.9 - 91.4 mg/dL   Total Protein 6.5 6.5 - 8.1 g/dL   Albumin 3.1 (L) 3.5 - 5.0 g/dL   AST 95 (H) 15 - 41 U/L   ALT 70 (H) 0 - 44 U/L   Alkaline Phosphatase 123 38 - 126 U/L   Total Bilirubin 0.4 0.0 - 1.2 mg/dL   GFR, Estimated >78 >29 mL/min    Comment: (NOTE) Calculated using the CKD-EPI Creatinine Equation (2021)    Anion gap 9 5 - 15    Comment: Performed at [facility], [address] [place], [place] [address]  CBC     Status: Abnormal   Collection Time: 09/03/23 12:24 AM  Result Value Ref Range   WBC 7.0 4.0 - 10.5 K/uL   RBC 3.87 3.87 - 5.11 MIL/uL   Hemoglobin 10.0 (L) 12.0 - 15.0 g/dL   HCT 08.6 (L) 57.8 - 46.9 %   MCV 84.0 80.0 - 100.0 fL   MCH 25.8 (L) 26.0 - 34.0 pg   MCHC 30.8 30.0 - 36.0 g/dL   RDW 62.9 52.8 - 41.3 %   Platelets 115 (L) 150 - 400 K/uL   nRBC 0.0 0.0 - 0.2 %    Comment: Performed at [facility], [address], [place], [place] [address]  hCG, serum, qualitative     Status: None   Collection Time: 09/03/23 12:24 AM  Result Value Ref Range   Preg, Serum NEGATIVE NEGATIVE    Comment:        THE SENSITIVITY OF  THIS METHODOLOGY IS >10 mIU/mL. Performed at [facility], [address] [place], [place] [address]   Troponin I (High Sensitivity)     Status: None   Collection Time: 09/03/23 12:24 AM  Result Value Ref Range   Troponin I (High Sensitivity) 3 <18 ng/L    Comment: (NOTE) Elevated  high sensitivity troponin I (hsTnI) values and significant  changes across serial measurements may suggest ACS but many other  chronic and acute conditions are known to elevate hsTnI results.  Refer to the "Links" section for chest pain algorithms and additional  guidance. Performed at [facility], [address], [place], [place] [address]   Troponin I (High Sensitivity)     Status: None   Collection Time: 09/03/23  2:28 AM  Result Value Ref Range   Troponin I (High Sensitivity) 3 <18 ng/L    Comment: (NOTE) Elevated high sensitivity troponin I (hsTnI) values and significant  changes across serial measurements may suggest ACS but many other  chronic and acute conditions are known to elevate hsTnI results.  Refer to the "Links" section for chest pain algorithms and additional  guidance. Performed at [facility], [address], [place], [place] [address]   Urinalysis, Routine w reflex microscopic -Urine, Clean Catch     Status: None   Collection Time: 09/03/23  4:54 AM  Result Value Ref Range   Color, Urine YELLOW YELLOW   APPearance CLEAR CLEAR   Specific Gravity, Urine 1.025 1.005 - 1.030   pH 5.0 5.0 - 8.0   Glucose, UA NEGATIVE NEGATIVE mg/dL   Hgb urine dipstick NEGATIVE NEGATIVE   Bilirubin Urine NEGATIVE NEGATIVE   Ketones, ur NEGATIVE NEGATIVE mg/dL   Protein, ur NEGATIVE NEGATIVE mg/dL   Nitrite NEGATIVE NEGATIVE   Leukocytes,Ua NEGATIVE NEGATIVE    Comment: Performed at [facility], [address], [place], [place] [address]   US Abdomen Limited RUQ (LIVER/GB) Result Date: 09/03/2023 CLINICAL DATA:  Right upper quadrant pain EXAM: ULTRASOUND ABDOMEN  LIMITED RIGHT UPPER QUADRANT COMPARISON:  08/30/2023 FINDINGS: Gallbladder: Gallbladder is decompressed, as the patient was not NPO for the exam. There is nonspecific gallbladder wall thickening measuring up to 6 mm. No evidence of shadowing gallstones. There is a positive sonographic Murphy sign. Trace pericholecystic fluid is noted. Common bile duct: Diameter: 3 mm Liver: No focal lesion identified. Within normal limits in parenchymal echogenicity. Portal vein is patent on color Doppler imaging with normal direction of blood flow towards the liver. Other: None. IMPRESSION: 1. Limited evaluation of the gallbladder given decompressed state and lack of NPO status. There is persistent gallbladder wall thickening and trace pericholecystic fluid, similar to prior CT exam. These findings, coupled with positive sonographic Murphy sign, are consistent with acute cholecystitis. If further imaging is desired, nuclear medicine hepatobiliary scan could be considered. Electronically Signed   By: Sharlet Salina M.D.   On: 09/03/2023 08:59   DG Chest 2 View Result Date: 09/03/2023 CLINICAL DATA:  Chest pain EXAM: CHEST - 2 VIEW COMPARISON:  08/30/2023 FINDINGS: The heart size and mediastinal contours are within normal limits. Both lungs are clear. The visualized skeletal structures are unremarkable. IMPRESSION: No active cardiopulmonary disease. Electronically Signed   By: Alcide Clever M.D.   On: 09/03/2023 00:41      Assessment/Plan RUQ abdominal pain, flank pain Possible biliary dyskinesia vs acute acalculous cholecystitis   Patient seen and examined and relevant labs and imaging personally reviewed. She has had abdominal pain and intermittent nausea with poor po intake since prior to 2/16 when last evaluated by general surgery in ED. She is afebrile with normal WBC and t bili WNL. LFTs mildly elevated AST/ALT 95/70 but improved from 4 days ago. CT 2/15 with pericholecystic fluid/wall thickening and [place] today with  similar findings. No cholelithiasis. Work  up and history concerning for possible biliary dyskinesia vs acute acalculous cholecystitis - will get HIDA for further eval. If positive for either - likely recommend lap chole for definitive management given her persistent symptoms. If negative may benefit from further work up for GI or urinary source (though UA and Cr nml) given persistent symptoms.  Will follow for HIDA results   FEN: NPO for further eval - HIDA pending ID: rocephin/flagyl in ED VTE: okay for chemical prophylaxis from surgical standpoint  I reviewed ED provider notes, last 24 h vitals and pain scores, last 48 h intake and output, last 24 h labs and trends, and last 24 h imaging results.   Eric Form, [facility] Surgery 09/03/2023, 1:14 PM Please see Amion for pager number during day hours 7:00am-4:30pm

## 2023-09-03 NOTE — ED Triage Notes (Signed)
Patient reports persistent RUQ pain with emesis onset Saturday last week , diagnosed with cholecystitis , she adds intermittent mid chest pain . Respirations unlabored .

## 2023-09-03 NOTE — ED Provider Notes (Signed)
[place] [facility] AT [facility] Provider Note   CSN: [id number] Arrival date & time: 09/03/23  0005     History  Chief Complaint  Patient presents with   Abdominal Pain   Chest Pain    Melissa Farley is a 23 y.o. female with history of ADHD, anxiety, asthma, trisomy X, who presents to the ER with persistent upper abdominal and intermittent chest pain. Initially started 5 days ago, came to the ED and was diagnosed with cholecystitis. Pt was seen by surgery and felt patient was stable for discharge with outpatient follow up and elective cholecystectomy. Pt presents again today with worsening pain. She is also complaining of intermittent nausea, none now. She has not eaten anything since last night.    Abdominal Pain Associated symptoms: chest pain, chills and nausea   Chest Pain Associated symptoms: abdominal pain and nausea        Home Medications Prior to Admission medications   Medication Sig Start Date End Date Taking? Authorizing Provider  acetaminophen (TYLENOL) 325 MG tablet Take 1,300 mg by mouth 2 (two) times daily as needed for fever.   Yes [provider]  AUROVELA 24 FE 1-20 MG-MCG(24) tablet Take 1 tablet by mouth daily. Patient not taking: Reported on 07/24/2023 07/05/23   [provider]  metroNIDAZOLE (FLAGYL) 500 MG tablet Take 1 tablet (500 mg total) by mouth 2 (two) times daily. Patient not taking: Reported on 08/31/2023 08/28/23   Coralyn Mark, NP  ondansetron (ZOFRAN-ODT) 4 MG disintegrating tablet Take 1 tablet (4 mg total) by mouth every 8 (eight) hours as needed. Patient not taking: Reported on 08/31/2023 08/07/23   Laurence Spates, MD      Allergies    Percocet [oxycodone-acetaminophen] and Penicillins    Review of Systems   Review of Systems  Constitutional:  Positive for chills.  Cardiovascular:  Positive for chest pain.  Gastrointestinal:  Positive for abdominal pain and nausea.  All other  systems reviewed and are negative.   Physical Exam Updated Vital Signs BP (!) 98/55 (BP Location: Right Arm)   Pulse 86   Temp 97.8 F (36.6 C) (Oral)   Resp 17   LMP 07/24/2023 (Approximate) Comment: has been having lots of vaginal bleeding in january  SpO2 98%  Physical Exam Vitals and nursing note reviewed.  Constitutional:      Appearance: Normal appearance.  HENT:     Head: Normocephalic and atraumatic.  Eyes:     Conjunctiva/sclera: Conjunctivae normal.  Cardiovascular:     Rate and Rhythm: Normal rate and regular rhythm.  Pulmonary:     Effort: Pulmonary effort is normal. No respiratory distress.     Breath sounds: Normal breath sounds.  Abdominal:     General: There is no distension.     Palpations: Abdomen is soft.     Tenderness: There is abdominal tenderness in the right upper quadrant and epigastric area.  Skin:    General: Skin is warm and dry.  Neurological:     General: No focal deficit present.     Mental Status: She is alert.     ED Results / Procedures / Treatments   Labs (all labs ordered are listed, but only abnormal results are displayed) Labs Reviewed  COMPREHENSIVE METABOLIC PANEL - Abnormal; Notable for the following components:      Result Value   Glucose, Bld 100 (*)    Albumin 3.1 (*)    AST 95 (*)  ALT 70 (*)    All other components within normal limits  CBC - Abnormal; Notable for the following components:   Hemoglobin 10.0 (*)    HCT 32.5 (*)    MCH 25.8 (*)    Platelets 115 (*)    All other components within normal limits  RESP PANEL BY RT-PCR (RSV, FLU A&B, COVID)  RVPGX2  LIPASE, BLOOD  URINALYSIS, ROUTINE W REFLEX MICROSCOPIC  HCG, SERUM, QUALITATIVE  TROPONIN I (HIGH SENSITIVITY)  TROPONIN I (HIGH SENSITIVITY)    EKG EKG Interpretation Date/Time:  Wednesday September 03 2023 00:15:36 EST Ventricular Rate:  98 PR Interval:  164 QRS Duration:  78 QT Interval:  340 QTC Calculation: 434 R Axis:   86  Text  Interpretation: Normal sinus rhythm Normal ECG Confirmed by Vonita Moss [phone]) on 09/03/2023 9:59:28 AM  Radiology US Abdomen Limited RUQ (LIVER/GB) Result Date: 09/03/2023 CLINICAL DATA:  Right upper quadrant pain EXAM: ULTRASOUND ABDOMEN LIMITED RIGHT UPPER QUADRANT COMPARISON:  08/30/2023 FINDINGS: Gallbladder: Gallbladder is decompressed, as the patient was not NPO for the exam. There is nonspecific gallbladder wall thickening measuring up to 6 mm. No evidence of shadowing gallstones. There is a positive sonographic Murphy sign. Trace pericholecystic fluid is noted. Common bile duct: Diameter: 3 mm Liver: No focal lesion identified. Within normal limits in parenchymal echogenicity. Portal vein is patent on color Doppler imaging with normal direction of blood flow towards the liver. Other: None. IMPRESSION: 1. Limited evaluation of the gallbladder given decompressed state and lack of NPO status. There is persistent gallbladder wall thickening and trace pericholecystic fluid, similar to prior CT exam. These findings, coupled with positive sonographic Murphy sign, are consistent with acute cholecystitis. If further imaging is desired, nuclear medicine hepatobiliary scan could be considered. Electronically Signed   By: Sharlet Salina M.D.   On: 09/03/2023 08:59   DG Chest 2 View Result Date: 09/03/2023 CLINICAL DATA:  Chest pain EXAM: CHEST - 2 VIEW COMPARISON:  08/30/2023 FINDINGS: The heart size and mediastinal contours are within normal limits. Both lungs are clear. The visualized skeletal structures are unremarkable. IMPRESSION: No active cardiopulmonary disease. Electronically Signed   By: Alcide Clever M.D.   On: 09/03/2023 00:41    Procedures Procedures    Medications Ordered in ED Medications  fentaNYL (SUBLIMAZE) injection 25 mcg (25 mcg Intravenous Given 09/03/23 0943)    ED Course/ Medical Decision Making/ A&P                                 Medical Decision Making Amount and/or  Complexity of Data Reviewed Labs: ordered. Radiology: ordered.  Risk Prescription drug management. Decision regarding hospitalization.   This patient is a 23 y.o. female  who presents to the ED for concern of persistent abdominal and chest pain. Diagnosed with cholecystitis 4 days ago.   Differential diagnoses prior to evaluation: The emergent differential diagnosis includes, but is not limited to,  PUD, gastritis, pancreatitis, cholecystitis, cholangitis. This is not an exhaustive differential.   Past Medical History / Co-morbidities / Social History: Trisomy X, ADHD, PTSH, GERD, anxiety  Additional history: Chart reviewed. Pertinent results include: Reviewed UC visit note on 2/13 and ER note from 2/15. Pt was diagnosed with acute cholecystitis, seen by general surgery, recommended outpatient laparoscopic cholecystectomy.   Physical Exam: Physical exam performed. The pertinent findings include:   Lab Tests/Imaging studies: Workup initiated in triage. I personally interpreted labs/imaging and the  pertinent results include:  no leukocytosis, stable hemoglobin. AST 95, ALT 70. Negative troponins x 2 . UA negative, urine culture sent due to persistent symptoms. Normal lipase. Negative pregnancy. Respiratory panel positive for influenza A.   CXR unremarkable. RUQ ultrasound shows persistent gallbladder wall thickening, trace fluid, consistent with acute cholecystitis. I agree with the radiologist interpretation.  HIDA scan ordered by surgery, pending.   Cardiac monitoring: EKG obtained and interpreted by myself and attending physician which shows: NSR   Medications: I ordered medication including fentanyl, rocephin, flagyl, IVF.  I have reviewed the patients home medicines and have made adjustments as needed.  Consultations obtained: I consulted with general surgery PA Kabrich who came and evaluated patient. Recommended following up on patient's vague urinary symptoms, will send  culture. Also recommended patient have HIDA scan. Surgery team not entirely convinced all her symptoms are related to her gallbladder. Plan to reconsult surgery team after HIDA scan to determine final disposition.    Disposition: After consideration of the diagnostic results and the patients response to treatment, I feel that patient would benefit from surgical evaluation and intervention for acute cholecystitis.  Patient discussed and care transferred to [facility] at shift change. Please see his/her note for further details regarding further ED course and disposition. Plan at time of handoff is follow up with surgery once HIDA scan performed to determine whether or not surgery will be admitting.   Final Clinical Impression(s) / ED Diagnoses Final diagnoses:  Acute cholecystitis    Rx / DC Orders ED Discharge Orders     None      Portions of this report may have been transcribed using voice recognition software. Every effort was made to ensure accuracy; however, inadvertent computerized transcription errors may be present.    Jeanella Flattery 09/03/23 1416    Rondel Baton, MD 09/03/23 2023

## 2023-09-03 NOTE — ED Notes (Signed)
 Pt ambulated independently to bathroom.

## 2023-09-04 ENCOUNTER — Observation Stay (HOSPITAL_COMMUNITY): Payer: Medicaid Other

## 2023-09-04 ENCOUNTER — Encounter (HOSPITAL_COMMUNITY): Payer: Self-pay

## 2023-09-04 DIAGNOSIS — R933 Abnormal findings on diagnostic imaging of other parts of digestive tract: Secondary | ICD-10-CM | POA: Diagnosis not present

## 2023-09-04 DIAGNOSIS — R110 Nausea: Secondary | ICD-10-CM | POA: Diagnosis not present

## 2023-09-04 DIAGNOSIS — R5381 Other malaise: Secondary | ICD-10-CM | POA: Diagnosis not present

## 2023-09-04 DIAGNOSIS — R1011 Right upper quadrant pain: Secondary | ICD-10-CM | POA: Diagnosis not present

## 2023-09-04 LAB — COMPREHENSIVE METABOLIC PANEL
ALT: 51 U/L — ABNORMAL HIGH (ref 0–44)
AST: 67 U/L — ABNORMAL HIGH (ref 15–41)
Albumin: 2.5 g/dL — ABNORMAL LOW (ref 3.5–5.0)
Alkaline Phosphatase: 113 U/L (ref 38–126)
Anion gap: 5 (ref 5–15)
BUN: <5 mg/dL — ABNORMAL LOW (ref 6–20)
CO2: 22 mmol/L (ref 22–32)
Calcium: 8.4 mg/dL — ABNORMAL LOW (ref 8.9–10.3)
Chloride: 110 mmol/L (ref 98–111)
Creatinine, Ser: 0.6 mg/dL (ref 0.44–1.00)
GFR, Estimated: >60 mL/min (ref >60)
Glucose, Bld: 108 mg/dL — ABNORMAL HIGH (ref 70–99)
Potassium: 3.8 mmol/L (ref 3.5–5.1)
Sodium: 137 mmol/L (ref 135–145)
Total Bilirubin: 0.4 mg/dL (ref 0.0–1.2)
Total Protein: 5.4 g/dL — ABNORMAL LOW (ref 6.5–8.1)

## 2023-09-04 LAB — CBC
HCT: 27.9 % — ABNORMAL LOW (ref 36.0–46.0)
Hemoglobin: 8.7 g/dL — ABNORMAL LOW (ref 12.0–15.0)
MCH: 26 pg (ref 26.0–34.0)
MCHC: 31.2 g/dL (ref 30.0–36.0)
MCV: 83.5 fL (ref 80.0–100.0)
Platelets: 100 10*3/uL — ABNORMAL LOW (ref 150–400)
RBC: 3.34 MIL/uL — ABNORMAL LOW (ref 3.87–5.11)
RDW: 14.8 % (ref 11.5–15.5)
WBC: 5.8 10*3/uL (ref 4.0–10.5)
nRBC: 0 % (ref 0.0–0.2)

## 2023-09-04 LAB — URINE CULTURE: Culture: <10000 — AB

## 2023-09-04 MED ORDER — HYDROMORPHONE HCL 1 MG/ML IJ SOLN
1.0000 mg | Freq: Once | INTRAMUSCULAR | Status: AC
  Administered 2023-09-04: 1 mg via INTRAVENOUS
  Filled 2023-09-04: qty 1

## 2023-09-04 MED ORDER — ENOXAPARIN SODIUM 30 MG/0.3ML IJ SOSY
30.0000 mg | PREFILLED_SYRINGE | INTRAMUSCULAR | Status: DC
  Filled 2023-09-04 (×3): qty 0.3

## 2023-09-04 MED ORDER — TECHNETIUM TC 99M MEBROFENIN IV KIT
5.4000 | PACK | Freq: Once | INTRAVENOUS | Status: AC | PRN
  Administered 2023-09-04: 5.4 via INTRAVENOUS

## 2023-09-04 MED ORDER — SODIUM CHLORIDE 0.9 % IV BOLUS
1000.0000 mL | Freq: Once | INTRAVENOUS | Status: AC
  Administered 2023-09-04: 1000 mL via INTRAVENOUS

## 2023-09-04 MED ORDER — GUAIFENESIN ER 600 MG PO TB12
600.0000 mg | ORAL_TABLET | Freq: Two times a day (BID) | ORAL | Status: DC | PRN
  Administered 2023-09-04: 600 mg via ORAL
  Filled 2023-09-04: qty 1

## 2023-09-04 NOTE — Plan of Care (Signed)

## 2023-09-04 NOTE — Progress Notes (Signed)
Patient reported going to the bathroom, endorsed squeezing R sided chest pain with breathing and dizziness. Patient got back to the bed and vomited 3x. Emesis consist of undigested food. Zofran and PRN pain med given. Contacted [facility] team x2, no answer. Will try again.   Melissa Farley

## 2023-09-04 NOTE — Progress Notes (Signed)
Called and unable to reach [facility].

## 2023-09-04 NOTE — Progress Notes (Signed)
Patient ID: Melissa Farley, female   DOB: 2001/05/22, 22 y.o.   MRN: [id number]   The patient's HIDA scan showed prompt filling of the gallbladder which rules out cholecystitis  The ejection fraction was 99% which could be hyperkinesia.  I would not recommend a cholecystectomy at this point given her flu diagnosis and anemia.  I think she needs further workup as an outpatient by gastroenterologist with consideration of endoscopy to make sure there is no other source of her anemia and her suspected gynecologic issues as again she reports she has periods the last 12 days.  I explained the HIDA results to her  We will start her back on a regular diet.

## 2023-09-04 NOTE — Progress Notes (Signed)
Subjective/Chief Complaint: Patient tested positive for the Flu Still with RUQ abdominal pain hurting through to the back Over all general malaise    Objective: Vital signs in last 24 hours: Temp:  [97.8 F (36.6 C)-98.8 F (37.1 C)] 98.8 F (37.1 C) (02/20 0614) Pulse Rate:  [65-89] 89 (02/20 0614) Resp:  [14-18] 18 (02/20 0614) BP: (84-115)/(40-70) 96/52 (02/20 0614) SpO2:  [94 %-100 %] 94 % (02/20 0614) Weight:  [47 kg] 47 kg (02/20 0223) Last BM Date : 09/04/23  Intake/Output from previous day: 02/19 0701 - 02/20 0700 In: 1000 [IV Piggyback:1000] Out: -  Intake/Output this shift: No intake/output data recorded.  Exam: Awake, follow commands Looks comfortable Abdomen soft with some tenderness in the RUQ  Lab Results:  Recent Labs    09/03/23 0024 09/03/23 1803  WBC 7.0 8.3  HGB 10.0* 8.6*  HCT 32.5* 28.3*  PLT 115* 111*   BMET Recent Labs    09/03/23 0024 09/03/23 1803  NA 139  --   K 4.1  --   CL 105  --   CO2 25  --   GLUCOSE 100*  --   BUN 9  --   CREATININE 0.63 0.63  CALCIUM 9.0  --    PT/INR No results for input(s): "LABPROT", "INR" in the last 72 hours. ABG No results for input(s): "PHART", "HCO3" in the last 72 hours.  Invalid input(s): "PCO2", "PO2"  Studies/Results: US Abdomen Limited RUQ (LIVER/GB) Result Date: 09/03/2023 CLINICAL DATA:  Right upper quadrant pain EXAM: ULTRASOUND ABDOMEN LIMITED RIGHT UPPER QUADRANT COMPARISON:  08/30/2023 FINDINGS: Gallbladder: Gallbladder is decompressed, as the patient was not NPO for the exam. There is nonspecific gallbladder wall thickening measuring up to 6 mm. No evidence of shadowing gallstones. There is a positive sonographic Murphy sign. Trace pericholecystic fluid is noted. Common bile duct: Diameter: 3 mm Liver: No focal lesion identified. Within normal limits in parenchymal echogenicity. Portal vein is patent on color Doppler imaging with normal direction of blood flow towards the liver.  Other: None. IMPRESSION: 1. Limited evaluation of the gallbladder given decompressed state and lack of NPO status. There is persistent gallbladder wall thickening and trace pericholecystic fluid, similar to prior CT exam. These findings, coupled with positive sonographic Murphy sign, are consistent with acute cholecystitis. If further imaging is desired, nuclear medicine hepatobiliary scan could be considered. Electronically Signed   By: Sharlet Salina M.D.   On: 09/03/2023 08:59   DG Chest 2 View Result Date: 09/03/2023 CLINICAL DATA:  Chest pain EXAM: CHEST - 2 VIEW COMPARISON:  08/30/2023 FINDINGS: The heart size and mediastinal contours are within normal limits. Both lungs are clear. The visualized skeletal structures are unremarkable. IMPRESSION: No active cardiopulmonary disease. Electronically Signed   By: Alcide Clever M.D.   On: 09/03/2023 00:41    Anti-infectives: Anti-infectives (From admission, onward)    Start     Dose/Rate Route Frequency Ordered Stop   09/03/23 1030  cefTRIAXone (ROCEPHIN) 2 g in sodium chloride 0.9 % 100 mL IVPB        2 g 200 mL/hr over 30 Minutes Intravenous Every 24 hours 09/03/23 1015     09/03/23 1030  metroNIDAZOLE (FLAGYL) IVPB 500 mg        500 mg 100 mL/hr over 60 Minutes Intravenous  Once 09/03/23 1015 09/03/23 1356       Assessment/Plan: RUQ abdominal pain, contracted gallbladder with out stones but questionable findings for cholecystitis now testing positive for the flu  -  HIDA ordered to evaluate for cholecystitis vs dyskinesia. WBC normal -all symptoms could be secondary to the flu -chronic anemia suspected to by gyn in origin.  May need further GI workup if HIDA negative -continue supportive care  Complex medical decision making  LOS: 0 days    Abigail Miyamoto 09/04/2023

## 2023-09-04 NOTE — Progress Notes (Signed)
Transition of Care [facility]) - Inpatient Brief Assessment   Patient Details  Name: Melissa Farley MRN: [id number] Date of Birth: 06/22/2001  [facility] CM/SW Contact:    Janae Bridgeman, RN Phone Number: 09/04/2023, 11:35 AM   Clinical Narrative: Patient admitted for abdominal pain.  [facility] is following at this time and HIDA scan was ordered.  Patient currently is positive for the influenza virus.  No TOC needs at this time.   Transition of Care Asessment: Insurance and Status: (P) Insurance coverage has been reviewed Patient has primary care physician: (P) Yes Home environment has been reviewed: (P) from home Prior level of function:: (P) Independent Prior/Current Home Services: (P) No current home services Social Drivers of Health Review: (P) SDOH reviewed interventions complete Readmission risk has been reviewed: (P) Yes Transition of care needs: (P) transition of care needs identified, TOC will continue to follow

## 2023-09-04 NOTE — Progress Notes (Signed)
Called [facility] team, unable to reach them. Messaged Dr. Joneen Roach instead.  Melissa Farley

## 2023-09-04 NOTE — ED Notes (Signed)
 Report given to floor nurse

## 2023-09-05 ENCOUNTER — Inpatient Hospital Stay (HOSPITAL_COMMUNITY): Payer: Medicaid Other

## 2023-09-05 ENCOUNTER — Encounter (HOSPITAL_COMMUNITY): Admission: EM | Disposition: A | Discharge status: Home / Self Care | Payer: Self-pay | Source: Home / Self Care

## 2023-09-05 ENCOUNTER — Encounter (HOSPITAL_COMMUNITY): Payer: Self-pay

## 2023-09-05 DIAGNOSIS — K5909 Other constipation: Secondary | ICD-10-CM

## 2023-09-05 DIAGNOSIS — Z823 Family history of stroke: Secondary | ICD-10-CM | POA: Diagnosis not present

## 2023-09-05 DIAGNOSIS — R7401 Elevation of levels of liver transaminase levels: Secondary | ICD-10-CM | POA: Diagnosis not present

## 2023-09-05 DIAGNOSIS — Z880 Allergy status to penicillin: Secondary | ICD-10-CM | POA: Diagnosis not present

## 2023-09-05 DIAGNOSIS — F419 Anxiety disorder, unspecified: Secondary | ICD-10-CM | POA: Diagnosis not present

## 2023-09-05 DIAGNOSIS — J101 Influenza due to other identified influenza virus with other respiratory manifestations: Secondary | ICD-10-CM | POA: Diagnosis not present

## 2023-09-05 DIAGNOSIS — Z825 Family history of asthma and other chronic lower respiratory diseases: Secondary | ICD-10-CM | POA: Diagnosis not present

## 2023-09-05 DIAGNOSIS — D649 Anemia, unspecified: Secondary | ICD-10-CM

## 2023-09-05 DIAGNOSIS — Z8249 Family history of ischemic heart disease and other diseases of the circulatory system: Secondary | ICD-10-CM | POA: Diagnosis not present

## 2023-09-05 DIAGNOSIS — R5381 Other malaise: Secondary | ICD-10-CM | POA: Diagnosis not present

## 2023-09-05 DIAGNOSIS — K219 Gastro-esophageal reflux disease without esophagitis: Secondary | ICD-10-CM | POA: Diagnosis not present

## 2023-09-05 DIAGNOSIS — Z1152 Encounter for screening for COVID-19: Secondary | ICD-10-CM | POA: Diagnosis not present

## 2023-09-05 DIAGNOSIS — K3189 Other diseases of stomach and duodenum: Secondary | ICD-10-CM

## 2023-09-05 DIAGNOSIS — R1310 Dysphagia, unspecified: Secondary | ICD-10-CM

## 2023-09-05 DIAGNOSIS — R110 Nausea: Secondary | ICD-10-CM | POA: Diagnosis not present

## 2023-09-05 DIAGNOSIS — R1011 Right upper quadrant pain: Secondary | ICD-10-CM

## 2023-09-05 DIAGNOSIS — K2950 Unspecified chronic gastritis without bleeding: Secondary | ICD-10-CM | POA: Diagnosis not present

## 2023-09-05 DIAGNOSIS — Z87440 Personal history of urinary (tract) infections: Secondary | ICD-10-CM | POA: Diagnosis not present

## 2023-09-05 DIAGNOSIS — M419 Scoliosis, unspecified: Secondary | ICD-10-CM | POA: Diagnosis not present

## 2023-09-05 DIAGNOSIS — Z885 Allergy status to narcotic agent status: Secondary | ICD-10-CM | POA: Diagnosis not present

## 2023-09-05 DIAGNOSIS — D500 Iron deficiency anemia secondary to blood loss (chronic): Secondary | ICD-10-CM | POA: Diagnosis not present

## 2023-09-05 DIAGNOSIS — D6489 Other specified anemias: Secondary | ICD-10-CM | POA: Diagnosis not present

## 2023-09-05 DIAGNOSIS — K810 Acute cholecystitis: Principal | ICD-10-CM

## 2023-09-05 DIAGNOSIS — D696 Thrombocytopenia, unspecified: Secondary | ICD-10-CM | POA: Diagnosis not present

## 2023-09-05 DIAGNOSIS — Q970 Karyotype 47, XXX: Secondary | ICD-10-CM | POA: Diagnosis not present

## 2023-09-05 DIAGNOSIS — K2970 Gastritis, unspecified, without bleeding: Secondary | ICD-10-CM

## 2023-09-05 DIAGNOSIS — R933 Abnormal findings on diagnostic imaging of other parts of digestive tract: Secondary | ICD-10-CM | POA: Diagnosis not present

## 2023-09-05 DIAGNOSIS — R54 Age-related physical debility: Secondary | ICD-10-CM | POA: Diagnosis not present

## 2023-09-05 DIAGNOSIS — Z803 Family history of malignant neoplasm of breast: Secondary | ICD-10-CM | POA: Diagnosis not present

## 2023-09-05 DIAGNOSIS — K581 Irritable bowel syndrome with constipation: Secondary | ICD-10-CM | POA: Diagnosis not present

## 2023-09-05 DIAGNOSIS — N920 Excessive and frequent menstruation with regular cycle: Secondary | ICD-10-CM | POA: Diagnosis not present

## 2023-09-05 DIAGNOSIS — F909 Attention-deficit hyperactivity disorder, unspecified type: Secondary | ICD-10-CM | POA: Diagnosis not present

## 2023-09-05 DIAGNOSIS — Z87891 Personal history of nicotine dependence: Secondary | ICD-10-CM | POA: Diagnosis not present

## 2023-09-05 DIAGNOSIS — R7989 Other specified abnormal findings of blood chemistry: Secondary | ICD-10-CM

## 2023-09-05 DIAGNOSIS — K828 Other specified diseases of gallbladder: Secondary | ICD-10-CM | POA: Diagnosis not present

## 2023-09-05 DIAGNOSIS — J4520 Mild intermittent asthma, uncomplicated: Secondary | ICD-10-CM | POA: Diagnosis not present

## 2023-09-05 HISTORY — PX: BIOPSY: SHX5522

## 2023-09-05 HISTORY — PX: ESOPHAGOGASTRODUODENOSCOPY (EGD) WITH PROPOFOL: SHX5813

## 2023-09-05 LAB — HEPATITIS PANEL, ACUTE
HCV Ab: NONREACTIVE
Hep A IgM: NONREACTIVE
Hep B C IgM: NONREACTIVE
Hepatitis B Surface Ag: NONREACTIVE

## 2023-09-05 SURGERY — ESOPHAGOGASTRODUODENOSCOPY (EGD) WITH PROPOFOL
Anesthesia: Monitor Anesthesia Care

## 2023-09-05 MED ORDER — EPHEDRINE SULFATE (PRESSORS) 50 MG/ML IJ SOLN
INTRAMUSCULAR | Status: DC | PRN
  Administered 2023-09-05: 10 mg via INTRAVENOUS

## 2023-09-05 MED ORDER — POLYETHYLENE GLYCOL 3350 17 G PO PACK
17.0000 g | PACK | Freq: Every day | ORAL | Status: DC
  Administered 2023-09-05 – 2023-09-06 (×2): 17 g via ORAL
  Filled 2023-09-05 (×2): qty 1

## 2023-09-05 MED ORDER — PROPOFOL 10 MG/ML IV BOLUS
INTRAVENOUS | Status: DC | PRN
  Administered 2023-09-05: 40 mg via INTRAVENOUS
  Administered 2023-09-05: 70 mg via INTRAVENOUS
  Administered 2023-09-05: 40 mg via INTRAVENOUS

## 2023-09-05 MED ORDER — PROPOFOL 500 MG/50ML IV EMUL
INTRAVENOUS | Status: DC | PRN
  Administered 2023-09-05: 150 ug/kg/min via INTRAVENOUS

## 2023-09-05 MED ORDER — HYDROMORPHONE HCL 1 MG/ML IJ SOLN
0.2500 mg | Freq: Four times a day (QID) | INTRAMUSCULAR | Status: DC | PRN
  Administered 2023-09-06: 0.25 mg via INTRAVENOUS
  Filled 2023-09-05: qty 0.5

## 2023-09-05 MED ORDER — SODIUM CHLORIDE 0.9 % IV SOLN: INTRAVENOUS | Status: DC | PRN

## 2023-09-05 MED ORDER — LIDOCAINE 2% (20 MG/ML) 5 ML SYRINGE
INTRAMUSCULAR | Status: DC | PRN
  Administered 2023-09-05: 40 mg via INTRAVENOUS

## 2023-09-05 MED ORDER — BISACODYL 10 MG RE SUPP
10.0000 mg | Freq: Once | RECTAL | Status: AC
  Administered 2023-09-05: 10 mg via RECTAL
  Filled 2023-09-05: qty 1

## 2023-09-05 MED ORDER — PANTOPRAZOLE SODIUM 40 MG PO TBEC
40.0000 mg | DELAYED_RELEASE_TABLET | Freq: Every day | ORAL | Status: DC
  Administered 2023-09-05 – 2023-09-06 (×2): 40 mg via ORAL
  Filled 2023-09-05 (×2): qty 1

## 2023-09-05 MED ORDER — HYDROMORPHONE HCL 1 MG/ML IJ SOLN
0.2500 mg | Freq: Four times a day (QID) | INTRAMUSCULAR | Status: DC | PRN
Start: 2023-09-05 — End: 2023-09-05

## 2023-09-05 SURGICAL SUPPLY — 14 items

## 2023-09-05 NOTE — H&P (View-Only) (Signed)
Referring Provider: Barnetta Chapel PA-C Primary Care Physician:  Pcp, No Primary Gastroenterologist: Gentry Fitz  Reason for Consultation: Abdominal pain, anemia  HPI: Melissa Farley is a 23 y.o. female with a past medical history of anxiety, ADHD, scoliosis, trisomy X and chronic constipation.  She presented to the ED 08/31/2023 with intermittent chest pain, RUQ pain and nausea without vomiting which started 08/24/2023. WBC 8.  Hemoglobin 11.3.  Platelets 87.  Total bili 0.9.  Alk phos 130.  AST 109.  ALT 78.  Lipase 33. Beta hCG < 1.  Acetaminophen level < 10.  CTAP showed.  Cholecystic fluid/wall thickening concerning for acute cholecystitis and mild periportal edema to the liver without focal mass.  She was seen by general surgery who recommended discharge with outpatient follow-up for elective laparoscopic cholecystectomy.  She presented back to the ED 09/03/2023 with persistent nausea and worsening RUQ pain. She reported having a fever of 105 F at home on 2/15.  Labs in the ED showed a WBC count of 7.0.  Hemoglobin 10.0.  Hematocrit 32.5.  Platelet 115.  Glucose 100.  BUN 9.  Creatinine 0.63.  Albumin 3.1.  Total bili 0.4.  Alk phos 123.  AST 95.  ALT 70.  Influenza A positive. hCG negative.  Troponin 3.  HIV nonreactive.  RUQ sonogram resulted in a limited evaluation of the gallbladder due to decompressed state with persistent gallbladder wall thickening and trace.  Cholecystic fluid with positive sonographic Murphy sign concerning for acute cholecystitis.  [facility] HIDA scan showed patent cystic and CBD ducts with a gallbladder ejection fracture 99% consistent with biliary hyperkinesia.  Hemoglobin dropped to 8.6 -> today Hg 8.7.  No overt GI bleeding.  General surgery requesting GI consult to further evaluate upper abdominal pain, recommend EGD prior to pursuing future cholecystectomy.  She developed nausea and RUQ pain on [place] Sunday, 08/24/2023 as noted above.  She stated vomiting for  the first time yesterday after she received a pain medication which she thinks was administered too quickly.  No hematemesis.  Further vomiting since then.  She continues to have RUQ pain which is worse after eating.  Infrequent NSAID use.  She had heartburn for few days within the past week but does not have chronic GERD.  No dysphagia.  She has chronic constipation and has lower abdominal pain when passing a bowel movement.  No black or bloody stools.  Denies alcohol or drug use.  History of prolonged menstrual cycles lasting 12 to 13 days in December and January.  She is approximately 16 days late for February menstrual cycle.  No family history of GI cancer.  Mother with history of breast cancer and had a cholecystectomy.  He endorses losing 9 pounds over the past month.  No history of eating disorder.  Past Medical History:  Diagnosis Date   Acute medial meniscus tear    ADHD (attention deficit hyperactivity disorder) 11/09/2012   Allergic rhinitis 11/09/2012   Allergy    Alopecia    Anxiety    Mom with severe anxiety, became addicted to pain meds   Asthma, mild intermittent 03/10/2016   Cardiac murmur 03/25/2013   Chronic kidney disease    Constipation    Fatigue    Frequent headaches    Poor eating habits    Scoliosis of thoracic spine 04/19/2014   UTI (urinary tract infection) 01/29/2022    Past Surgical History:  Procedure Laterality Date   NO PAST SURGERIES      Prior  to Admission medications   Medication Sig Start Date End Date Taking? Authorizing Provider  acetaminophen (TYLENOL) 325 MG tablet Take 1,300 mg by mouth 2 (two) times daily as needed for fever.   Yes [provider]  AUROVELA 24 FE 1-20 MG-MCG(24) tablet Take 1 tablet by mouth daily. Patient not taking: Reported on 07/24/2023 07/05/23   [provider]  metroNIDAZOLE (FLAGYL) 500 MG tablet Take 1 tablet (500 mg total) by mouth 2 (two) times daily. Patient not taking: Reported on 08/31/2023  08/28/23   Coralyn Mark, NP  ondansetron (ZOFRAN-ODT) 4 MG disintegrating tablet Take 1 tablet (4 mg total) by mouth every 8 (eight) hours as needed. Patient not taking: Reported on 08/31/2023 08/07/23   Laurence Spates, MD    Current Facility-Administered Medications  Medication Dose Route Frequency Provider Last Rate Last Admin   acetaminophen (TYLENOL) tablet 1,000 mg  1,000 mg Oral Q6H Eric Form, PA-C   1,000 mg at 09/05/23 4098   diphenhydrAMINE (BENADRYL) capsule 25 mg  25 mg Oral Q6H PRN Eric Form, PA-C       Or   diphenhydrAMINE (BENADRYL) injection 25 mg  25 mg Intravenous Q6H PRN Eric Form, PA-C       docusate sodium (COLACE) capsule 100 mg  100 mg Oral BID Eric Form, PA-C   100 mg at 09/05/23 0830   enoxaparin (LOVENOX) injection 30 mg  30 mg Subcutaneous Q24H Joaquim Nam, RPH       guaiFENesin (MUCINEX) 12 hr tablet 600 mg  600 mg Oral BID PRN Eric Form, PA-C   600 mg at 09/04/23 1135   HYDROmorphone (DILAUDID) injection 0.25 mg  0.25 mg Intravenous Q6H PRN Barnetta Chapel, PA-C       melatonin tablet 3 mg  3 mg Oral QHS PRN Eric Form, PA-C       methocarbamol (ROBAXIN) tablet 500 mg  500 mg Oral Q8H PRN Eric Form, PA-C       Or   methocarbamol (ROBAXIN) injection 500 mg  500 mg Intravenous Q8H PRN Eric Form, PA-C       metoprolol tartrate (LOPRESSOR) injection 5 mg  5 mg Intravenous Q6H PRN Eric Form, PA-C       ondansetron (ZOFRAN-ODT) disintegrating tablet 4 mg  4 mg Oral Q6H PRN Eric Form, PA-C   4 mg at 09/03/23 2259   Or   ondansetron (ZOFRAN) injection 4 mg  4 mg Intravenous Q6H PRN Eric Form, PA-C   4 mg at 09/04/23 2109   oxyCODONE (Oxy IR/ROXICODONE) immediate release tablet 5 mg  5 mg Oral Q4H PRN Eric Form, PA-C       polyethylene glycol (MIRALAX / GLYCOLAX) packet 17 g  17 g Oral Daily PRN Eric Form, PA-C       simethicone (MYLICON) chewable tablet 40 mg   40 mg Oral Q6H PRN Eric Form, PA-C       traMADol Janean Sark) tablet 50 mg  50 mg Oral Q6H PRN Eric Form, PA-C   50 mg at 09/04/23 2109    Allergies as of 09/03/2023 - Review Complete 09/03/2023  Allergen Reaction Noted   Percocet [oxycodone-acetaminophen] Nausea And Vomiting 02/15/2022   Penicillins Rash 03/11/2013    Family History  Problem Relation Age of Onset   Miscarriages / [place] Mother    Cancer Mother    Drug abuse Mother    Stroke Father  Asthma Father    Drug abuse Father    Asthma Sister    Behavior problems Sister    Asthma Maternal Aunt    Heart disease Maternal Uncle    HIV Paternal Uncle    Diabetes Maternal Grandfather    Hypertension Maternal Grandfather    Alcohol abuse Other    Birth defects Neg Hx     Social History   Socioeconomic History   Marital status: Single    Spouse name: Not on file   Number of children: 1   Years of education: Not on file   Highest education level: Not on file  Occupational History   Not on file  Tobacco Use   Smoking status: Former    Types: E-cigarettes   Smokeless tobacco: Never   Tobacco comments:    Mom stated this was a short time freshman year.  Vaping Use   Vaping status: Former   Quit date: 06/14/2021   Substances: Nicotine, Flavoring  Substance and Sexual Activity   Alcohol use: No   Drug use: No   Sexual activity: Not Currently    Birth control/protection: Pill  Other Topics Concern   Not on file  Social History Narrative   Lives with grandparents since age 110-6, large extended family of cousins, aunts who are very supportive   Grandmother deceased       Intrauterine drug exposure      Father passed away in 2014/09/11          Grandmother passed away       Social Drivers of Health   Financial Resource Strain: Not on file  Food Insecurity: No Food Insecurity (09/04/2023)   Hunger Vital Sign    Worried About Running Out of Food in the Last Year: Never true    Ran Out of Food  in the Last Year: Never true  Transportation Needs: No Transportation Needs (09/04/2023)   PRAPARE - Administrator, Civil Service (Medical): No    Lack of Transportation (Non-Medical): No  Physical Activity: Not on file  Stress: Not on file  Social Connections: Unknown (11/27/2021)   Received from [facility]   Social Network    Social Network: Not on file  Intimate Partner Violence: Not At Risk (09/04/2023)   Humiliation, Afraid, Rape, and Kick questionnaire    Fear of Current or Ex-Partner: No    Emotionally Abused: No    Physically Abused: No    Sexually Abused: No   Review of Systems: Gen: See HPI. CV: Denies chest pain, palpitations or edema. Resp: Denies cough, shortness of breath of hemoptysis.  GI: See HPI.  GU : Denies urinary burning, blood in urine, increased urinary frequency or incontinence. MS: Denies joint pain, muscles aches or weakness. Derm: Denies rash, itchiness, skin lesions or unhealing ulcers. Psych: Denies depression, anxiety, memory loss or confusion. Heme: Denies easy bruising, bleeding. Neuro:  Denies headaches, dizziness or paresthesias. Endo:  Denies any problems with DM, thyroid or adrenal function.  Physical Exam: Vital signs in last 24 hours: Temp:  [97.7 F (36.5 C)-99.1 F (37.3 C)] 97.7 F (36.5 C) (02/21 0918) Pulse Rate:  [61-70] 61 (02/21 0918) Resp:  [18-19] 18 (02/21 0918) BP: (92-99)/(50-69) 96/65 (02/21 0946) SpO2:  [98 %-100 %] 99 % (02/21 0918) Last BM Date : 09/04/23 General: Fatigued appearing 23 year old female in no acute distress. Head:  Normocephalic and atraumatic. Eyes:  No scleral icterus. Conjunctiva pink. Ears:  Normal auditory acuity. Nose:  No deformity, discharge or lesions. Mouth:  Dentition intact. No ulcers or lesions.  Neck:  Supple. No lymphadenopathy or thyromegaly.  Lungs: Breath sounds clear throughout. No wheezes, rhonchi or crackles.  Heart: Regular rate and rhythm, no  murmurs. Abdomen: Soft, nondistended.  RUQ tenderness without rebound or guarding.  Positive bowel sounds to all 4 quadrants. Rectal: Deferred. Musculoskeletal:  Symmetrical without gross deformities.  Pulses:  Normal pulses noted. Extremities:  Without clubbing or edema. Neurologic:  Alert and  oriented x 4. No focal deficits.  Skin:  Intact without significant lesions or rashes. Psych:  Alert and cooperative. Normal mood and affect.  Intake/Output from previous day: 02/20 0701 - 02/21 0700 In: 2762.1 [I.V.:1661.3; IV Piggyback:1100.8] Out: -  Intake/Output this shift: Total I/O In: 80 [P.O.:80] Out: -   Lab Results: Recent Labs    09/03/23 0024 09/03/23 1803 09/04/23 1125  WBC 7.0 8.3 5.8  HGB 10.0* 8.6* 8.7*  HCT 32.5* 28.3* 27.9*  PLT 115* 111* 100*   BMET Recent Labs    09/03/23 0024 09/03/23 1803 09/04/23 1125  NA 139  --  137  K 4.1  --  3.8  CL 105  --  110  CO2 25  --  22  GLUCOSE 100*  --  108*  BUN 9  --  <5*  CREATININE 0.63 0.63 0.60  CALCIUM 9.0  --  8.4*   LFT Recent Labs    09/04/23 1125  PROT 5.4*  ALBUMIN 2.5*  AST 67*  ALT 51*  ALKPHOS 113  BILITOT 0.4   PT/INR No results for input(s): "LABPROT", "INR" in the last 72 hours. Hepatitis Panel No results for input(s): "HEPBSAG", "HCVAB", "HEPAIGM", "HEPBIGM" in the last 72 hours.    Studies/Results: NM Hepato W/EF Result Date: 09/04/2023 CLINICAL DATA:  Right upper quadrant pain. EXAM: NUCLEAR MEDICINE HEPATOBILIARY IMAGING WITH GALLBLADDER EF TECHNIQUE: Sequential images of the abdomen were obtained out to 60 minutes following intravenous administration of radiopharmaceutical. After oral ingestion of Ensure, gallbladder ejection fraction was determined. At 60 min, normal ejection fraction is greater than 33%. RADIOPHARMACEUTICALS:  5.4 mCi Tc-38m  Choletec IV COMPARISON:  None Available. FINDINGS: Prompt uptake and biliary excretion of activity by the liver is seen. Gallbladder activity  is visualized, consistent with patency of cystic duct. Biliary activity passes into small bowel, consistent with patent common bile duct. Calculated gallbladder ejection fraction is 99%. (Normal gallbladder ejection fraction with Ensure is greater than 33% and less than 80%.) IMPRESSION: Normal hepatobiliary scan, demonstrating patency of both the cystic and common bile ducts. Increased gallbladder ejection fraction of 99%, which may be seen with biliary hyperkinesia. Electronically Signed   By: Danae Orleans M.D.   On: 09/04/2023 10:46    IMPRESSION/PLAN:  23 year old female admitted with nausea and RUQ pain. Prior CTAP 08/30/2023 and RUQ sono 2/19  findings suggestive of acute cholecystitis. HIDA scan 2/20 showed a gallbladder EF of 99% consistent with biliary hyperkinesia. EGD requested by general surgery to rule out PUD or other upper GI etiology for symptoms prior to pursuing a laparoscopic cholecystectomy. Mildly elevated LFTs.  Normal lipase level. -NPO -IV fluids and pain management per the hospitalist -EGD today benefits and risks discussed including risk with sedation, risk of bleeding, perforation and infection.  EGD procedure/benefit/risk discussed with the patient, her boyfriend and mother. -Pantoprazole 40 mg IV daily -Add acute hepatitis panel to prior a.m. lab draw  Acute on chronic normocytic anemia.  No overt GI bleeding.  History of lengthy  menstrual cycles Dec 2024 - 07/2023. -EGD as ordered above, to include duodenal biopsies to rule out celiac disease -CBC and iron panel in a.m.  Thrombocytopenia, ?  viral etiology  Influenza A positive, stable respiratory status  Chronic constipation -MiraLAX nightly -Outpatient GI follow-up         Arnaldo Natal  09/05/2023, 11:29 AM

## 2023-09-05 NOTE — Anesthesia Preprocedure Evaluation (Addendum)
Anesthesia Evaluation  Patient identified by MRN, date of birth, ID band Patient awake    Reviewed: Allergy & Precautions, NPO status , Patient's Chart, lab work & pertinent test results  Airway Mallampati: II       Dental no notable dental hx.    Pulmonary asthma , former smoker   Pulmonary exam normal        Cardiovascular negative cardio ROS Normal cardiovascular exam     Neuro/Psych  Headaches PSYCHIATRIC DISORDERS Anxiety        GI/Hepatic Neg liver ROS,,,  Endo/Other  negative endocrine ROS    Renal/GU negative Renal ROS     Musculoskeletal Scoliosis of thoracic spine   Abdominal   Peds  Hematology  (+) Blood dyscrasia, anemia Thrombocytopenia    Anesthesia Other Findings RUQ pain  Reproductive/Obstetrics Hcg negative                             Anesthesia Physical Anesthesia Plan  ASA: 2  Anesthesia Plan: MAC   Post-op Pain Management:    Induction: Intravenous  PONV Risk Score and Plan: 2 and Propofol infusion and Treatment may vary due to age or medical condition  Airway Management Planned: Nasal Cannula  Additional Equipment:   Intra-op Plan:   Post-operative Plan:   Informed Consent: I have reviewed the patients History and Physical, chart, labs and discussed the procedure including the risks, benefits and alternatives for the proposed anesthesia with the patient or authorized representative who has indicated his/her understanding and acceptance.     Dental advisory given  Plan Discussed with: CRNA  Anesthesia Plan Comments:        Anesthesia Quick Evaluation

## 2023-09-05 NOTE — Progress Notes (Signed)
Subjective/Chief Complaint: Still with RUQ/back/chest pain today.  Ate a honey bun/pizza last night and then vomited later on.  She did have pain meds about 1hr before vomiting, but doesn't think this is what made her nauseated.  pain seems constant and not necessarily worse after eating.   Objective: Vital signs in last 24 hours: Temp:  [98.3 F (36.8 C)-99.1 F (37.3 C)] 98.3 F (36.8 C) (02/21 0500) Pulse Rate:  [68-70] 70 (02/21 0500) Resp:  [18-19] 19 (02/21 0500) BP: (92-99)/(57-69) 92/57 (02/21 0500) SpO2:  [98 %-100 %] 98 % (02/21 0500) Last BM Date : 09/04/23  Intake/Output from previous day: 02/20 0701 - 02/21 0700 In: 2762.1 [I.V.:1661.3; IV Piggyback:1100.8] Out: -  Intake/Output this shift: Total I/O In: 80 [P.O.:80] Out: -   Exam: Awake, follow commands Looks comfortable Heart: regular Lungs: CTAB Abdomen: soft with some tenderness in the RUQ, no guarding, ND  Lab Results:  Recent Labs    09/03/23 1803 09/04/23 1125  WBC 8.3 5.8  HGB 8.6* 8.7*  HCT 28.3* 27.9*  PLT 111* 100*   BMET Recent Labs    09/03/23 0024 09/03/23 1803 09/04/23 1125  NA 139  --  137  K 4.1  --  3.8  CL 105  --  110  CO2 25  --  22  GLUCOSE 100*  --  108*  BUN 9  --  <5*  CREATININE 0.63 0.63 0.60  CALCIUM 9.0  --  8.4*   PT/INR No results for input(s): "LABPROT", "INR" in the last 72 hours. ABG No results for input(s): "PHART", "HCO3" in the last 72 hours.  Invalid input(s): "PCO2", "PO2"  Studies/Results: NM Hepato W/EF Result Date: 09/04/2023 CLINICAL DATA:  Right upper quadrant pain. EXAM: NUCLEAR MEDICINE HEPATOBILIARY IMAGING WITH GALLBLADDER EF TECHNIQUE: Sequential images of the abdomen were obtained out to 60 minutes following intravenous administration of radiopharmaceutical. After oral ingestion of Ensure, gallbladder ejection fraction was determined. At 60 min, normal ejection fraction is greater than 33%. RADIOPHARMACEUTICALS:  5.4 mCi Tc-41m   Choletec IV COMPARISON:  None Available. FINDINGS: Prompt uptake and biliary excretion of activity by the liver is seen. Gallbladder activity is visualized, consistent with patency of cystic duct. Biliary activity passes into small bowel, consistent with patent common bile duct. Calculated gallbladder ejection fraction is 99%. (Normal gallbladder ejection fraction with Ensure is greater than 33% and less than 80%.) IMPRESSION: Normal hepatobiliary scan, demonstrating patency of both the cystic and common bile ducts. Increased gallbladder ejection fraction of 99%, which may be seen with biliary hyperkinesia. Electronically Signed   By: Danae Orleans M.D.   On: 09/04/2023 10:46    Anti-infectives: Anti-infectives (From admission, onward)    Start     Dose/Rate Route Frequency Ordered Stop   09/03/23 1030  cefTRIAXone (ROCEPHIN) 2 g in sodium chloride 0.9 % 100 mL IVPB        2 g 200 mL/hr over 30 Minutes Intravenous Every 24 hours 09/03/23 1015     09/03/23 1030  metroNIDAZOLE (FLAGYL) IVPB 500 mg        500 mg 100 mL/hr over 60 Minutes Intravenous  Once 09/03/23 1015 09/03/23 1356       Assessment/Plan: RUQ abdominal pain, contracted gallbladder with out stones but questionable findings for cholecystitis/hyperkinesia -HIDA with EF of 99%.  Some data out there that suggests hyperkinesia can cause similar symptoms; however, this is not totally straightforward case and given anemia, we will ask GI to evaluate her to rule  out other GI issues prior to possibly offering a lap chole. -d/w patient the plan for this. -chronic anemia suspected to by gyn in origin due to heavy menses, but unclear. -CLD per GI until they see her  FEN: CLD VTE: lovenox ID: Rocephin x 2 days, but AF and normal WBC.  No evidence of cholecystitis on [place], will DC this at this time.  Flu - supportive care Anemia  LOS: 0 days    Letha Cape 09/05/2023

## 2023-09-05 NOTE — Plan of Care (Signed)

## 2023-09-05 NOTE — Transfer of Care (Signed)
Immediate Anesthesia Transfer of Care Note  Patient: Melissa Farley  Procedure(s) Performed: ESOPHAGOGASTRODUODENOSCOPY (EGD) WITH PROPOFOL BIOPSY  Patient Location: PACU; endo  Anesthesia Type:MAC  Level of Consciousness: drowsy  Airway & Oxygen Therapy: Patient Spontanous Breathing and Patient connected to nasal cannula oxygen  Post-op Assessment: Report given to RN and Post -op Vital signs reviewed and stable  Post vital signs: Reviewed and stable  Last Vitals:  Vitals Value Taken Time  BP 100/48 09/05/23 1550  Temp 36.5 C 09/05/23 1550  Pulse 58 09/05/23 1556  Resp 14 09/05/23 1556  SpO2 100 % 09/05/23 1556  Vitals shown include unfiled device data.  Last Pain:  Vitals:   09/05/23 1550  TempSrc: Temporal  PainSc: Asleep         Complications: No notable events documented.

## 2023-09-05 NOTE — Op Note (Signed)
[facility] Patient Name: Melissa Farley Procedure Date : 09/05/2023 MRN: [id number] Attending MD: Particia Lather , , [id number] Date of Birth: [birth year] CSN: [id number] Age: 23 Admit Type: Inpatient Procedure:                Upper GI endoscopy Indications:              Abdominal pain in the right upper quadrant,                            Dysphagia Providers:                Madelyn Brunner" Golden Hurter, RN, Salley Scarlet, Technician Referring MD:             Hospitalist team Medicines:                Monitored Anesthesia Care Complications:            No immediate complications. Estimated Blood Loss:     Estimated blood loss was minimal. Procedure:                Pre-Anesthesia Assessment:                           - Prior to the procedure, a History and Physical                            was performed, and patient medications and                            allergies were reviewed. The patient's tolerance of                            previous anesthesia was also reviewed. The risks                            and benefits of the procedure and the sedation                            options and risks were discussed with the patient.                            All questions were answered, and informed consent                            was obtained. Prior Anticoagulants: The patient has                            taken no anticoagulant or antiplatelet agents. ASA                            Grade Assessment: II - A patient with mild systemic  disease. After reviewing the risks and benefits,                            the patient was deemed in satisfactory condition to                            undergo the procedure.                           After obtaining informed consent, the endoscope was                            passed under direct vision. Throughout the                            procedure, the  patient's blood pressure, pulse, and                            oxygen saturations were monitored continuously. The                            [id number]-[id number] ([id number]) Olympus endoscope was introduced                            through the mouth, and advanced to the second part                            of duodenum. The upper GI endoscopy was                            accomplished without difficulty. The patient                            tolerated the procedure well. Scope In: Scope Out: Findings:      The examined esophagus was normal. Biopsies were taken with a cold       forceps for histology.      Localized mild inflammation characterized by congestion (edema) and       erythema was found in the gastric body. Biopsies were taken with a cold       forceps for histology.      The examined duodenum was normal. Biopsies for histology were taken with       a cold forceps for evaluation of celiac disease. Impression:               - Normal esophagus. Biopsied.                           - Gastritis. Biopsied.                           - Normal examined duodenum. Biopsied. Recommendation:           - Return patient to hospital ward for ongoing care.                           - No  clear source of abdominal pain on today's exam.                           - Will start on daily Miralax and Dulcolax                            suppository to help with constipation                           - Start pantoprazole 40 mg daily.                           - Await pathology results.                           - Would recommend consideration of cholecystectomy                            by general surgery.                           - The findings and recommendations were discussed                            with the patient. Procedure Code(s):        --- Professional ---                           [phone], Esophagogastroduodenoscopy, flexible,                            transoral; with biopsy, single or  multiple Diagnosis Code(s):        --- Professional ---                           K29.70, Gastritis, unspecified, without bleeding                           R10.11, Right upper quadrant pain                           R13.10, Dysphagia, unspecified CPT copyright 2022 [employer]. All rights reserved. The codes documented in this report are preliminary and upon coder review may  be revised to meet current compliance requirements. Dr Particia Lather "Alan Ripper" [place],  09/05/2023 3:52:32 PM Number of Addenda: 0

## 2023-09-05 NOTE — Interval H&P Note (Signed)
History and Physical Interval Note:  09/05/2023 3:01 PM  Melissa Farley  has presented today for surgery, with the diagnosis of RUQ pain.  The various methods of treatment have been discussed with the patient and family. After consideration of risks, benefits and other options for treatment, the patient has consented to  Procedure(s): ESOPHAGOGASTRODUODENOSCOPY (EGD) WITH PROPOFOL (N/A) as a surgical intervention.  The patient's history has been reviewed, patient examined, no change in status, stable for surgery.  I have reviewed the patient's chart and labs.  Questions were answered to the patient's satisfaction.     Imogene Burn

## 2023-09-05 NOTE — Consult Note (Signed)
Referring Provider: Barnetta Chapel PA-C Primary Care Physician:  Pcp, No Primary Gastroenterologist: Gentry Fitz  Reason for Consultation: Abdominal pain, anemia  HPI: Melissa Farley is a 23 y.o. female with a past medical history of anxiety, ADHD, scoliosis, trisomy X and chronic constipation.  She presented to the ED 08/31/2023 with intermittent chest pain, RUQ pain and nausea without vomiting which started 08/24/2023. WBC 8.  Hemoglobin 11.3.  Platelets 87.  Total bili 0.9.  Alk phos 130.  AST 109.  ALT 78.  Lipase 33. Beta hCG < 1.  Acetaminophen level < 10.  CTAP showed.  Cholecystic fluid/wall thickening concerning for acute cholecystitis and mild periportal edema to the liver without focal mass.  She was seen by general surgery who recommended discharge with outpatient follow-up for elective laparoscopic cholecystectomy.  She presented back to the ED 09/03/2023 with persistent nausea and worsening RUQ pain. She reported having a fever of 105 F at home on 2/15.  Labs in the ED showed a WBC count of 7.0.  Hemoglobin 10.0.  Hematocrit 32.5.  Platelet 115.  Glucose 100.  BUN 9.  Creatinine 0.63.  Albumin 3.1.  Total bili 0.4.  Alk phos 123.  AST 95.  ALT 70.  Influenza A positive. hCG negative.  Troponin 3.  HIV nonreactive.  RUQ sonogram resulted in a limited evaluation of the gallbladder due to decompressed state with persistent gallbladder wall thickening and trace.  Cholecystic fluid with positive sonographic Murphy sign concerning for acute cholecystitis.  [facility] HIDA scan showed patent cystic and CBD ducts with a gallbladder ejection fracture 99% consistent with biliary hyperkinesia.  Hemoglobin dropped to 8.6 -> today Hg 8.7.  No overt GI bleeding.  General surgery requesting GI consult to further evaluate upper abdominal pain, recommend EGD prior to pursuing future cholecystectomy.  She developed nausea and RUQ pain on [place] Bowl Sunday, 08/24/2023 as noted above.  She stated vomiting for  the first time yesterday after she received a pain medication which she thinks was administered too quickly.  No hematemesis.  Further vomiting since then.  She continues to have RUQ pain which is worse after eating.  Infrequent NSAID use.  She had heartburn for few days within the past week but does not have chronic GERD.  No dysphagia.  She has chronic constipation and has lower abdominal pain when passing a bowel movement.  No black or bloody stools.  Denies alcohol or drug use.  History of prolonged menstrual cycles lasting 12 to 13 days in December and January.  She is approximately 16 days late for February menstrual cycle.  No family history of GI cancer.  Mother with history of breast cancer and had a cholecystectomy.  He endorses losing 9 pounds over the past month.  No history of eating disorder.  Past Medical History:  Diagnosis Date   Acute medial meniscus tear    ADHD (attention deficit hyperactivity disorder) 11/09/2012   Allergic rhinitis 11/09/2012   Allergy    Alopecia    Anxiety    Mom with severe anxiety, became addicted to pain meds   Asthma, mild intermittent 03/10/2016   Cardiac murmur 03/25/2013   Chronic kidney disease    Constipation    Fatigue    Frequent headaches    Poor eating habits    Scoliosis of thoracic spine 04/19/2014   UTI (urinary tract infection) 01/29/2022    Past Surgical History:  Procedure Laterality Date   NO PAST SURGERIES      Prior  to Admission medications   Medication Sig Start Date End Date Taking? Authorizing Provider  acetaminophen (TYLENOL) 325 MG tablet Take 1,300 mg by mouth 2 (two) times daily as needed for fever.   Yes [provider]  AUROVELA 24 FE 1-20 MG-MCG(24) tablet Take 1 tablet by mouth daily. Patient not taking: Reported on 07/24/2023 07/05/23   [provider]  metroNIDAZOLE (FLAGYL) 500 MG tablet Take 1 tablet (500 mg total) by mouth 2 (two) times daily. Patient not taking: Reported on 08/31/2023  08/28/23   Coralyn Mark, NP  ondansetron (ZOFRAN-ODT) 4 MG disintegrating tablet Take 1 tablet (4 mg total) by mouth every 8 (eight) hours as needed. Patient not taking: Reported on 08/31/2023 08/07/23   Laurence Spates, MD    Current Facility-Administered Medications  Medication Dose Route Frequency Provider Last Rate Last Admin   acetaminophen (TYLENOL) tablet 1,000 mg  1,000 mg Oral Q6H Eric Form, PA-C   1,000 mg at 09/05/23 4098   diphenhydrAMINE (BENADRYL) capsule 25 mg  25 mg Oral Q6H PRN Eric Form, PA-C       Or   diphenhydrAMINE (BENADRYL) injection 25 mg  25 mg Intravenous Q6H PRN Eric Form, PA-C       docusate sodium (COLACE) capsule 100 mg  100 mg Oral BID Eric Form, PA-C   100 mg at 09/05/23 0830   enoxaparin (LOVENOX) injection 30 mg  30 mg Subcutaneous Q24H Joaquim Nam, RPH       guaiFENesin (MUCINEX) 12 hr tablet 600 mg  600 mg Oral BID PRN Eric Form, PA-C   600 mg at 09/04/23 1135   HYDROmorphone (DILAUDID) injection 0.25 mg  0.25 mg Intravenous Q6H PRN Barnetta Chapel, PA-C       melatonin tablet 3 mg  3 mg Oral QHS PRN Eric Form, PA-C       methocarbamol (ROBAXIN) tablet 500 mg  500 mg Oral Q8H PRN Eric Form, PA-C       Or   methocarbamol (ROBAXIN) injection 500 mg  500 mg Intravenous Q8H PRN Eric Form, PA-C       metoprolol tartrate (LOPRESSOR) injection 5 mg  5 mg Intravenous Q6H PRN Eric Form, PA-C       ondansetron (ZOFRAN-ODT) disintegrating tablet 4 mg  4 mg Oral Q6H PRN Eric Form, PA-C   4 mg at 09/03/23 2259   Or   ondansetron (ZOFRAN) injection 4 mg  4 mg Intravenous Q6H PRN Eric Form, PA-C   4 mg at 09/04/23 2109   oxyCODONE (Oxy IR/ROXICODONE) immediate release tablet 5 mg  5 mg Oral Q4H PRN Eric Form, PA-C       polyethylene glycol (MIRALAX / GLYCOLAX) packet 17 g  17 g Oral Daily PRN Eric Form, PA-C       simethicone (MYLICON) chewable tablet 40 mg   40 mg Oral Q6H PRN Eric Form, PA-C       traMADol Janean Sark) tablet 50 mg  50 mg Oral Q6H PRN Eric Form, PA-C   50 mg at 09/04/23 2109    Allergies as of 09/03/2023 - Review Complete 09/03/2023  Allergen Reaction Noted   Percocet [oxycodone-acetaminophen] Nausea And Vomiting 02/15/2022   Penicillins Rash 03/11/2013    Family History  Problem Relation Age of Onset   Miscarriages / [place] Mother    Cancer Mother    Drug abuse Mother    Stroke Father  Asthma Father    Drug abuse Father    Asthma Sister    Behavior problems Sister    Asthma Maternal Aunt    Heart disease Maternal Uncle    HIV Paternal Uncle    Diabetes Maternal Grandfather    Hypertension Maternal Grandfather    Alcohol abuse Other    Birth defects Neg Hx     Social History   Socioeconomic History   Marital status: Single    Spouse name: Not on file   Number of children: 1   Years of education: Not on file   Highest education level: Not on file  Occupational History   Not on file  Tobacco Use   Smoking status: Former    Types: E-cigarettes   Smokeless tobacco: Never   Tobacco comments:    Mom stated this was a short time freshman year.  Vaping Use   Vaping status: Former   Quit date: 06/14/2021   Substances: Nicotine, Flavoring  Substance and Sexual Activity   Alcohol use: No   Drug use: No   Sexual activity: Not Currently    Birth control/protection: Pill  Other Topics Concern   Not on file  Social History Narrative   Lives with grandparents since age 110-6, large extended family of cousins, aunts who are very supportive   Grandmother deceased       Intrauterine drug exposure      Father passed away in 2014/09/11          Grandmother passed away       Social Drivers of Health   Financial Resource Strain: Not on file  Food Insecurity: No Food Insecurity (09/04/2023)   Hunger Vital Sign    Worried About Running Out of Food in the Last Year: Never true    Ran Out of Food  in the Last Year: Never true  Transportation Needs: No Transportation Needs (09/04/2023)   PRAPARE - Administrator, Civil Service (Medical): No    Lack of Transportation (Non-Medical): No  Physical Activity: Not on file  Stress: Not on file  Social Connections: Unknown (11/27/2021)   Received from [facility]   Social Network    Social Network: Not on file  Intimate Partner Violence: Not At Risk (09/04/2023)   Humiliation, Afraid, Rape, and Kick questionnaire    Fear of Current or Ex-Partner: No    Emotionally Abused: No    Physically Abused: No    Sexually Abused: No   Review of Systems: Gen: See HPI. CV: Denies chest pain, palpitations or edema. Resp: Denies cough, shortness of breath of hemoptysis.  GI: See HPI.  GU : Denies urinary burning, blood in urine, increased urinary frequency or incontinence. MS: Denies joint pain, muscles aches or weakness. Derm: Denies rash, itchiness, skin lesions or unhealing ulcers. Psych: Denies depression, anxiety, memory loss or confusion. Heme: Denies easy bruising, bleeding. Neuro:  Denies headaches, dizziness or paresthesias. Endo:  Denies any problems with DM, thyroid or adrenal function.  Physical Exam: Vital signs in last 24 hours: Temp:  [97.7 F (36.5 C)-99.1 F (37.3 C)] 97.7 F (36.5 C) (02/21 0918) Pulse Rate:  [61-70] 61 (02/21 0918) Resp:  [18-19] 18 (02/21 0918) BP: (92-99)/(50-69) 96/65 (02/21 0946) SpO2:  [98 %-100 %] 99 % (02/21 0918) Last BM Date : 09/04/23 General: Fatigued appearing 23 year old female in no acute distress. Head:  Normocephalic and atraumatic. Eyes:  No scleral icterus. Conjunctiva pink. Ears:  Normal auditory acuity. Nose:  No deformity, discharge or lesions. Mouth:  Dentition intact. No ulcers or lesions.  Neck:  Supple. No lymphadenopathy or thyromegaly.  Lungs: Breath sounds clear throughout. No wheezes, rhonchi or crackles.  Heart: Regular rate and rhythm, no  murmurs. Abdomen: Soft, nondistended.  RUQ tenderness without rebound or guarding.  Positive bowel sounds to all 4 quadrants. Rectal: Deferred. Musculoskeletal:  Symmetrical without gross deformities.  Pulses:  Normal pulses noted. Extremities:  Without clubbing or edema. Neurologic:  Alert and  oriented x 4. No focal deficits.  Skin:  Intact without significant lesions or rashes. Psych:  Alert and cooperative. Normal mood and affect.  Intake/Output from previous day: 02/20 0701 - 02/21 0700 In: 2762.1 [I.V.:1661.3; IV Piggyback:1100.8] Out: -  Intake/Output this shift: Total I/O In: 80 [P.O.:80] Out: -   Lab Results: Recent Labs    09/03/23 0024 09/03/23 1803 09/04/23 1125  WBC 7.0 8.3 5.8  HGB 10.0* 8.6* 8.7*  HCT 32.5* 28.3* 27.9*  PLT 115* 111* 100*   BMET Recent Labs    09/03/23 0024 09/03/23 1803 09/04/23 1125  NA 139  --  137  K 4.1  --  3.8  CL 105  --  110  CO2 25  --  22  GLUCOSE 100*  --  108*  BUN 9  --  <5*  CREATININE 0.63 0.63 0.60  CALCIUM 9.0  --  8.4*   LFT Recent Labs    09/04/23 1125  PROT 5.4*  ALBUMIN 2.5*  AST 67*  ALT 51*  ALKPHOS 113  BILITOT 0.4   PT/INR No results for input(s): "LABPROT", "INR" in the last 72 hours. Hepatitis Panel No results for input(s): "HEPBSAG", "HCVAB", "HEPAIGM", "HEPBIGM" in the last 72 hours.    Studies/Results: NM Hepato W/EF Result Date: 09/04/2023 CLINICAL DATA:  Right upper quadrant pain. EXAM: NUCLEAR MEDICINE HEPATOBILIARY IMAGING WITH GALLBLADDER EF TECHNIQUE: Sequential images of the abdomen were obtained out to 60 minutes following intravenous administration of radiopharmaceutical. After oral ingestion of Ensure, gallbladder ejection fraction was determined. At 60 min, normal ejection fraction is greater than 33%. RADIOPHARMACEUTICALS:  5.4 mCi Tc-38m  Choletec IV COMPARISON:  None Available. FINDINGS: Prompt uptake and biliary excretion of activity by the liver is seen. Gallbladder activity  is visualized, consistent with patency of cystic duct. Biliary activity passes into small bowel, consistent with patent common bile duct. Calculated gallbladder ejection fraction is 99%. (Normal gallbladder ejection fraction with Ensure is greater than 33% and less than 80%.) IMPRESSION: Normal hepatobiliary scan, demonstrating patency of both the cystic and common bile ducts. Increased gallbladder ejection fraction of 99%, which may be seen with biliary hyperkinesia. Electronically Signed   By: Danae Orleans M.D.   On: 09/04/2023 10:46    IMPRESSION/PLAN:  23 year old female admitted with nausea and RUQ pain. Prior CTAP 08/30/2023 and RUQ sono 2/19  findings suggestive of acute cholecystitis. HIDA scan 2/20 showed a gallbladder EF of 99% consistent with biliary hyperkinesia. EGD requested by general surgery to rule out PUD or other upper GI etiology for symptoms prior to pursuing a laparoscopic cholecystectomy. Mildly elevated LFTs.  Normal lipase level. -NPO -IV fluids and pain management per the hospitalist -EGD today benefits and risks discussed including risk with sedation, risk of bleeding, perforation and infection.  EGD procedure/benefit/risk discussed with the patient, her boyfriend and mother. -Pantoprazole 40 mg IV daily -Add acute hepatitis panel to prior a.m. lab draw  Acute on chronic normocytic anemia.  No overt GI bleeding.  History of lengthy  menstrual cycles Dec 2024 - 07/2023. -EGD as ordered above, to include duodenal biopsies to rule out celiac disease -CBC and iron panel in a.m.  Thrombocytopenia, ?  viral etiology  Influenza A positive, stable respiratory status  Chronic constipation -MiraLAX nightly -Outpatient GI follow-up         Arnaldo Natal  09/05/2023, 11:29 AM

## 2023-09-06 ENCOUNTER — Inpatient Hospital Stay (HOSPITAL_COMMUNITY): Payer: Medicaid Other

## 2023-09-06 DIAGNOSIS — K828 Other specified diseases of gallbladder: Secondary | ICD-10-CM | POA: Diagnosis not present

## 2023-09-06 DIAGNOSIS — R1011 Right upper quadrant pain: Secondary | ICD-10-CM | POA: Diagnosis not present

## 2023-09-06 DIAGNOSIS — Z1152 Encounter for screening for COVID-19: Secondary | ICD-10-CM | POA: Diagnosis not present

## 2023-09-06 DIAGNOSIS — Q970 Karyotype 47, XXX: Secondary | ICD-10-CM | POA: Diagnosis not present

## 2023-09-06 DIAGNOSIS — R110 Nausea: Secondary | ICD-10-CM | POA: Diagnosis not present

## 2023-09-06 DIAGNOSIS — R933 Abnormal findings on diagnostic imaging of other parts of digestive tract: Secondary | ICD-10-CM | POA: Diagnosis not present

## 2023-09-06 DIAGNOSIS — Z87440 Personal history of urinary (tract) infections: Secondary | ICD-10-CM | POA: Diagnosis not present

## 2023-09-06 DIAGNOSIS — K2970 Gastritis, unspecified, without bleeding: Secondary | ICD-10-CM | POA: Diagnosis not present

## 2023-09-06 DIAGNOSIS — R5381 Other malaise: Secondary | ICD-10-CM | POA: Diagnosis not present

## 2023-09-06 DIAGNOSIS — D6489 Other specified anemias: Secondary | ICD-10-CM | POA: Diagnosis not present

## 2023-09-06 DIAGNOSIS — J4520 Mild intermittent asthma, uncomplicated: Secondary | ICD-10-CM | POA: Diagnosis not present

## 2023-09-06 DIAGNOSIS — R079 Chest pain, unspecified: Secondary | ICD-10-CM | POA: Diagnosis not present

## 2023-09-06 DIAGNOSIS — K581 Irritable bowel syndrome with constipation: Secondary | ICD-10-CM | POA: Diagnosis not present

## 2023-09-06 DIAGNOSIS — J101 Influenza due to other identified influenza virus with other respiratory manifestations: Secondary | ICD-10-CM | POA: Diagnosis not present

## 2023-09-06 DIAGNOSIS — D696 Thrombocytopenia, unspecified: Secondary | ICD-10-CM | POA: Diagnosis not present

## 2023-09-06 DIAGNOSIS — D500 Iron deficiency anemia secondary to blood loss (chronic): Secondary | ICD-10-CM | POA: Diagnosis not present

## 2023-09-06 DIAGNOSIS — N920 Excessive and frequent menstruation with regular cycle: Secondary | ICD-10-CM | POA: Diagnosis not present

## 2023-09-06 DIAGNOSIS — R1310 Dysphagia, unspecified: Secondary | ICD-10-CM | POA: Diagnosis not present

## 2023-09-06 LAB — COMPREHENSIVE METABOLIC PANEL
ALT: 42 U/L (ref 0–44)
AST: 62 U/L — ABNORMAL HIGH (ref 15–41)
Albumin: 2.6 g/dL — ABNORMAL LOW (ref 3.5–5.0)
Alkaline Phosphatase: 86 U/L (ref 38–126)
Anion gap: 7 (ref 5–15)
BUN: 5 mg/dL — ABNORMAL LOW (ref 6–20)
CO2: 23 mmol/L (ref 22–32)
Calcium: 8.3 mg/dL — ABNORMAL LOW (ref 8.9–10.3)
Chloride: 108 mmol/L (ref 98–111)
Creatinine, Ser: 0.62 mg/dL (ref 0.44–1.00)
GFR, Estimated: >60 mL/min (ref >60)
Glucose, Bld: 91 mg/dL (ref 70–99)
Potassium: 4.8 mmol/L (ref 3.5–5.1)
Sodium: 138 mmol/L (ref 135–145)
Total Bilirubin: 0.3 mg/dL (ref 0.0–1.2)
Total Protein: 5.6 g/dL — ABNORMAL LOW (ref 6.5–8.1)

## 2023-09-06 LAB — VITAMIN B12: Vitamin B-12: 372 pg/mL (ref 180–914)

## 2023-09-06 LAB — CBC
HCT: 28.4 % — ABNORMAL LOW (ref 36.0–46.0)
Hemoglobin: 8.6 g/dL — ABNORMAL LOW (ref 12.0–15.0)
MCH: 25.1 pg — ABNORMAL LOW (ref 26.0–34.0)
MCHC: 30.3 g/dL (ref 30.0–36.0)
MCV: 83 fL (ref 80.0–100.0)
Platelets: 110 10*3/uL — ABNORMAL LOW (ref 150–400)
RBC: 3.42 MIL/uL — ABNORMAL LOW (ref 3.87–5.11)
RDW: 15 % (ref 11.5–15.5)
WBC: 4.9 10*3/uL (ref 4.0–10.5)
nRBC: 0 % (ref 0.0–0.2)

## 2023-09-06 LAB — IRON AND TIBC
Iron: 25 ug/dL — ABNORMAL LOW (ref 28–170)
Saturation Ratios: 6 % — ABNORMAL LOW (ref 10.4–31.8)
TIBC: 433 ug/dL (ref 250–450)
UIBC: 408 ug/dL

## 2023-09-06 LAB — FERRITIN: Ferritin: 53 ng/mL (ref 11–307)

## 2023-09-06 LAB — FOLATE: Folate: 8.7 ng/mL (ref >5.9)

## 2023-09-06 MED ORDER — TRAMADOL HCL 50 MG PO TABS: 50.0000 mg | ORAL_TABLET | Freq: Four times a day (QID) | ORAL | 0 refills | Status: DC | PRN

## 2023-09-06 MED ORDER — PANTOPRAZOLE SODIUM 40 MG PO TBEC: 40.0000 mg | DELAYED_RELEASE_TABLET | Freq: Every day | ORAL | 0 refills | Status: DC

## 2023-09-06 MED ORDER — KETOROLAC TROMETHAMINE 15 MG/ML IJ SOLN
15.0000 mg | Freq: Once | INTRAMUSCULAR | Status: AC
Start: 2023-09-06 — End: 2023-09-06
  Administered 2023-09-06: 15 mg via INTRAVENOUS
  Filled 2023-09-06: qty 1

## 2023-09-06 MED ORDER — ACETAMINOPHEN 500 MG PO TABS: 1000.0000 mg | ORAL_TABLET | Freq: Two times a day (BID) | ORAL | Status: DC | PRN

## 2023-09-06 MED ORDER — SODIUM CHLORIDE 0.9 % IV BOLUS
1000.0000 mL | Freq: Once | INTRAVENOUS | Status: AC
  Administered 2023-09-06: 1000 mL via INTRAVENOUS

## 2023-09-06 NOTE — Discharge Instructions (Signed)
 Please stop Vaping as this can lead to worsening of some of your symptoms, but is also not healthy for you.

## 2023-09-06 NOTE — Progress Notes (Signed)
 PT BP 87/46, unable to reach after hour [facility] office. Nurse reached out to Dr. Joneen Roach, 1L  NS bolus ordered.

## 2023-09-06 NOTE — Progress Notes (Signed)
 Pt complaining of bilateral chest ache and tightness that radiates to her back with worsening cough. Patient requested for chest x-ray, she is concern about developing pneumonia. PRN pain med administered, Barnetta Chapel, PA notified. Unable to reach after hour for [facility] office.

## 2023-09-06 NOTE — Progress Notes (Signed)
 The patient discharged home with her husband. PIV has been out and site looks clean, dry and intact. Education provided regarding her home medication  and follow up. Patient verbalize understanding. AVS and work note handed to the patient.

## 2023-09-06 NOTE — Discharge Summary (Addendum)
 Patient ID: Melissa Farley [id number] [birth year] 22 y.o.  Admit date: 09/03/2023 Discharge date: 09/06/2023  Admitting Diagnosis: Influenza Abdominal pain, unclear etiology, abnormal gallbladder on imaging  Discharge Diagnosis Patient Active Problem List   Diagnosis Date Noted   Acute cholecystitis 09/05/2023   Anemia 09/05/2023   Dysphagia 09/05/2023   Elevated LFTs 09/05/2023   Abdominal pain 09/03/2023   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   Gastroesophageal reflux disease without esophagitis 06/24/2019   Dermatitis due to cat hair 05/21/2019   BMI (body mass index), pediatric, less than 5th percentile for age 41/29/2019   Vasovagal syncope 03/07/2015   Scoliosis of thoracic spine 04/19/2014   PTSD (post-traumatic stress disorder) 08/03/2013   Murmur 03/25/2013   ADHD (attention deficit hyperactivity disorder) 11/09/2012   Allergic rhinitis 11/09/2012  Biliary hyperkinesia, unclear significance Gastritis Chest pain, suspect pleurisy from Flu Influenza Chronic anemia, likely secondary to heavy menses  Consultants Dr. Leonides Schanz, GI  Reason for Admission: 23 y.o. female with medical history significant for ADHD, asthma, IBS -C, anxiety who presented to [facility] ED with abdominal pain. Pain initially began 2/9 located in RUQ and radiating into her back. When symptoms first started she had fever of 105F and emesis x3. She was seen in UC and then in the ED on 2/16 for her symptoms after she had lack of improvement. At that time general surgery Dr. Bedelia Person evaluated with suspicion for biliary dyskinesia and outpatient follow up and elective lap chole recommended. Since then, pain has not improved and she had acute worsening last night prompting presentation to ED. She does not remember any precipitating factors and does not recall what she ate if anything prior to the pain worsening. She has had reduced appetite and nausea since ED visit 2/16 but no emesis. At baseline she has IBS -  constipation with last BM Saturday which was normal for her. She has had one sick contact with n/v but otherwise no sick contacts. She has had one other similar pain episode in January of this year. Otherwise she has noticed some urinary symptoms of urgency, frequency, and sensation of incomplete bladder emptying. She has noted blood on toilet paper when wiping after urination on one occasion and was not on her period at that time.   Procedures EGD, Dr. Leonides Schanz 09/05/23  Hospital Course:  The patient was admitted and symptomatically treated for influenza.  She remained AF with a normal WBC.  She did have some chest pain with inspiration that was sharp in nature.  EKG was normal.  Multiple CXRs were normal as well with no evidence of PNA.  Suspect pleurisy as etiology given description of pain and no other objective cardiopulmonary findings.  She is not tachycardic and her O2 sats are 100% on room air.  She has no aberrant heart sounds on physical exam that make me concerned about a cardiac complication from the flu.   She was evaluated for her abdominal pain as well.  She underwent a HIDA scan that revealed no evidence for cholecystitis and an EF of 99%.  She continued to have pain in her epigastrium and in her RUQ.  Her hgb was noted to fall to around 8.6, but with no active bleeding.  she does have heavy menstrual cycles so suspect this may be the etiology, but did ask for GI evaluation with EGD to rule out GI source and to evaluate other etiologies of her pain.  She was noted to have gastritis and bx were taken of her  esophagus as well as her stomach to rule out celiac disease.  These biopsies are currently pending and GI will follow up on these.  She was placed on Protonix for her gastritis.  She is currently eating with some chronic mild nausea and some continued abdominal discomfort.  She is not vomiting any more.  At this time, we will plan to treat her gastritis.  If this does not resolve her symptoms,  then we will have her follow up to discussed elective lap chole for possible biliary hyperkinesia.  The patient and her husband who were at the bedside are agreeable with this plan.  Physical Exam: Gen: NAD, somewhat frail appearing 23 yo female Heart: regular, no murmurs or rubs Lungs: CTAB, no chest wall tenderness Abd: soft, some tenderness in epigastrium and RUQ, otherwise ND, +BS  Allergies as of 09/06/2023       Reactions   Percocet [oxycodone-acetaminophen] Nausea And Vomiting   Nightmares    Penicillins Rash        Medication List     PAUSE taking these medications    Aurovela 24 FE 1-20 MG-MCG(24) tablet Wait to take this until your doctor or other care provider tells you to start again. Generic drug: Norethindrone Acetate-Ethinyl Estrad-FE Take 1 tablet by mouth daily.       STOP taking these medications    metroNIDAZOLE 500 MG tablet Commonly known as: FLAGYL       TAKE these medications    acetaminophen 500 MG tablet Commonly known as: TYLENOL Take 2 tablets (1,000 mg total) by mouth 2 (two) times daily as needed for fever or moderate pain (pain score 4-6). What changed:  medication strength how much to take reasons to take this   ondansetron 4 MG disintegrating tablet Commonly known as: ZOFRAN-ODT Take 1 tablet (4 mg total) by mouth every 8 (eight) hours as needed.   pantoprazole 40 MG tablet Commonly known as: PROTONIX Take 1 tablet (40 mg total) by mouth daily.   traMADol 50 MG tablet Commonly known as: ULTRAM Take 1 tablet (50 mg total) by mouth every 6 (six) hours as needed for moderate pain (pain score 4-6).          Follow-up Information     Diamantina Monks, MD Follow up.   Specialty: Surgery Why: As needed for gallbladder if symptoms not improved with protonix for gastritis or inflammation of your stomach lining Contact information: [address] [place] [address] [facility] [place]  [address] [phone]         Imogene Burn, MD Follow up.   Specialty: Gastroenterology Why: As needed for gastritis Contact information: [address] [place] [address] [phone]                 Signed: Barnetta Chapel, [facility] Surgery 09/06/2023, 11:14 AM Please see Amion for pager number during day hours 7:00am-4:30pm, 7-11:30am on Weekends

## 2023-09-06 NOTE — Anesthesia Postprocedure Evaluation (Signed)
 Anesthesia Post Note  Patient: [name]  Procedure(s) Performed: ESOPHAGOGASTRODUODENOSCOPY (EGD) WITH PROPOFOL BIOPSY     Patient location during evaluation: Endoscopy Anesthesia Type: MAC Level of consciousness: awake Pain management: pain level controlled Vital Signs Assessment: post-procedure vital signs reviewed and stable Respiratory status: spontaneous breathing, nonlabored ventilation and respiratory function stable Cardiovascular status: blood pressure returned to baseline and stable Postop Assessment: no apparent nausea or vomiting Anesthetic complications: no   No notable events documented.  Last Vitals:  Vitals:   09/05/23 2356 09/06/23 0451  BP: (!) 87/46 97/61  Pulse: 85 65  Resp: 18 18  Temp: 36.9 C 36.8 C  SpO2: 98% 100%    Last Pain:  Vitals:   09/06/23 0541  TempSrc:   PainSc: 9                  Kelsey Durflinger P Riggs Dineen

## 2023-09-06 NOTE — Plan of Care (Signed)

## 2023-09-08 ENCOUNTER — Encounter (HOSPITAL_COMMUNITY): Payer: Self-pay | Admitting: Internal Medicine

## 2023-09-08 LAB — ANA: Anti Nuclear Antibody (ANA): NEGATIVE

## 2023-09-08 LAB — ANTI-SMOOTH MUSCLE ANTIBODY, IGG: F-Actin IgG: 27 U — ABNORMAL HIGH (ref 0–19)

## 2023-09-08 LAB — IGA: IgA: 148 mg/dL (ref 87–352)

## 2023-09-08 LAB — IGG: IgG (Immunoglobin G), Serum: 1216 mg/dL (ref 586–1602)

## 2023-09-08 LAB — TISSUE TRANSGLUTAMINASE, IGA: Tissue Transglutaminase Ab, IgA: <2 U/mL (ref 0–3)

## 2023-09-09 ENCOUNTER — Encounter: Payer: Self-pay | Admitting: Internal Medicine

## 2023-09-09 LAB — SURGICAL PATHOLOGY

## 2023-09-10 ENCOUNTER — Ambulatory Visit: Payer: Medicaid Other

## 2023-10-13 ENCOUNTER — Ambulatory Visit (INDEPENDENT_AMBULATORY_CARE_PROVIDER_SITE_OTHER): Payer: Medicaid Other | Admitting: Internal Medicine

## 2023-10-13 ENCOUNTER — Other Ambulatory Visit (INDEPENDENT_AMBULATORY_CARE_PROVIDER_SITE_OTHER)

## 2023-10-13 ENCOUNTER — Encounter: Payer: Self-pay | Admitting: Internal Medicine

## 2023-10-13 VITALS — BP 98/60 | HR 78 | Ht 69.02 in | Wt 103.4 lb

## 2023-10-13 DIAGNOSIS — R768 Other specified abnormal immunological findings in serum: Secondary | ICD-10-CM | POA: Diagnosis not present

## 2023-10-13 DIAGNOSIS — D696 Thrombocytopenia, unspecified: Secondary | ICD-10-CM

## 2023-10-13 DIAGNOSIS — K219 Gastro-esophageal reflux disease without esophagitis: Secondary | ICD-10-CM | POA: Diagnosis not present

## 2023-10-13 DIAGNOSIS — D509 Iron deficiency anemia, unspecified: Secondary | ICD-10-CM

## 2023-10-13 DIAGNOSIS — R7989 Other specified abnormal findings of blood chemistry: Secondary | ICD-10-CM | POA: Diagnosis not present

## 2023-10-13 DIAGNOSIS — R1011 Right upper quadrant pain: Secondary | ICD-10-CM

## 2023-10-13 DIAGNOSIS — K5900 Constipation, unspecified: Secondary | ICD-10-CM

## 2023-10-13 DIAGNOSIS — K828 Other specified diseases of gallbladder: Secondary | ICD-10-CM | POA: Diagnosis not present

## 2023-10-13 LAB — CBC WITH DIFFERENTIAL/PLATELET
Basophils Absolute: 0 10*3/uL (ref 0.0–0.1)
Basophils Relative: 0.9 % (ref 0.0–3.0)
Eosinophils Absolute: 0.1 10*3/uL (ref 0.0–0.7)
Eosinophils Relative: 1.8 % (ref 0.0–5.0)
HCT: 35.3 % — ABNORMAL LOW (ref 36.0–46.0)
Hemoglobin: 11.5 g/dL — ABNORMAL LOW (ref 12.0–15.0)
Lymphocytes Relative: 39.1 % (ref 12.0–46.0)
Lymphs Abs: 1.6 10*3/uL (ref 0.7–4.0)
MCHC: 32.5 g/dL (ref 30.0–36.0)
MCV: 80.6 fl (ref 78.0–100.0)
Monocytes Absolute: 0.4 10*3/uL (ref 0.1–1.0)
Monocytes Relative: 10.3 % (ref 3.0–12.0)
Neutro Abs: 2 10*3/uL (ref 1.4–7.7)
Neutrophils Relative %: 47.9 % (ref 43.0–77.0)
Platelets: 150 10*3/uL (ref 150.0–400.0)
RBC: 4.38 Mil/uL (ref 3.87–5.11)
RDW: 17.2 % — ABNORMAL HIGH (ref 11.5–14.6)
WBC: 4.2 10*3/uL — ABNORMAL LOW (ref 4.5–10.5)

## 2023-10-13 LAB — IBC + FERRITIN
Ferritin: 5.1 ng/mL — ABNORMAL LOW (ref 10.0–291.0)
Iron: 49 ug/dL (ref 42–145)
Saturation Ratios: 8.3 % — ABNORMAL LOW (ref 20.0–50.0)
TIBC: 590.8 ug/dL — ABNORMAL HIGH (ref 250.0–450.0)
Transferrin: 422 mg/dL — ABNORMAL HIGH (ref 212.0–360.0)

## 2023-10-13 LAB — HEPATIC FUNCTION PANEL
ALT: 15 U/L (ref 0–35)
AST: 21 U/L (ref 0–37)
Albumin: 4.6 g/dL (ref 3.5–5.2)
Alkaline Phosphatase: 73 U/L (ref 39–117)
Bilirubin, Direct: 0.1 mg/dL (ref 0.0–0.3)
Total Bilirubin: 0.6 mg/dL (ref 0.2–1.2)
Total Protein: 7.9 g/dL (ref 6.0–8.3)

## 2023-10-13 MED ORDER — LINACLOTIDE 145 MCG PO CAPS
145.0000 ug | ORAL_CAPSULE | Freq: Every day | ORAL | 3 refills | Status: DC
Start: 2023-10-13 — End: 2023-12-15

## 2023-10-13 MED ORDER — PANTOPRAZOLE SODIUM 40 MG PO TBEC: 40.0000 mg | DELAYED_RELEASE_TABLET | Freq: Every day | ORAL | 0 refills | Status: DC

## 2023-10-13 NOTE — Patient Instructions (Addendum)
 Your provider has requested that you go to the basement level for lab work before leaving today. Press "B" on the elevator. The lab is located at the first door on the left as you exit the elevator.  We have sent the following medications to your pharmacy for you to pick up at your convenience: Pantoprazole, Linzess  Drink 8 cups of water a day and walk 30 minutes a day.  Please purchase the following medications over the counter and take as directed: Fiber supplement such as Benefiber- use as directed daily  A referral was placed for Hematology they will contact you to schedule a appointment  You are scheduled for a follow up visit on 01/14/24 at 2:10 pm with Dr Leonides Schanz  If your blood pressure at your visit was 140/90 or greater, please contact your primary care physician to follow up on this.  _______________________________________________________  If you are age 21 or older, your body mass index should be between 23-30. Your Body mass index is 15.26 kg/m. If this is out of the aforementioned range listed, please consider follow up with your Primary Care Provider.  If you are age 6 or younger, your body mass index should be between 19-25. Your Body mass index is 15.26 kg/m. If this is out of the aformentioned range listed, please consider follow up with your Primary Care Provider.   ________________________________________________________  The [facility] providers would like to encourage you to use [facility] to communicate with providers for non-urgent requests or questions.  Due to long hold times on the telephone, sending your provider a message by [facility] may be a faster and more efficient way to get a response.  Please allow 48 business hours for a response.  Please remember that this is for non-urgent requests.  _______________________________________________________  Due to recent changes in healthcare laws, you may see the results of your imaging and laboratory studies on MyChart  before your provider has had a chance to review them.  We understand that in some cases there may be results that are confusing or concerning to you. Not all laboratory results come back in the same time frame and the provider may be waiting for multiple results in order to interpret others.  Please give [place] 48 hours in order for your provider to thoroughly review all the results before contacting the office for clarification of your results.   Thank you for entrusting me with your care and for choosing [facility],  Dr. Eulah Pont

## 2023-10-13 NOTE — Progress Notes (Unsigned)
 Chief Complaint: IDA, elevated LFTs  HPI : 23 year old female with history of trisomy X, scoliosis, ADHD, and anxiety presents for follow up of IDA and elevated LFTs  Since she left the hospital, she has been doing well. She is tired all the time. She is still having some of the ab pain in the RUQ that travels to the back. If she is working or walking around and she gets more stressed, she will have ab pain. She wasn't able to get pantoprazole because she lost her ID and so she couldn't pick up her medication. She had 3 periods over the last month. Her cramps will be very severe. She used to be on birth control but she stopped in 06/2023-07/2023. She is not on any iron supplements. Her daughter is 16 months. She is no longer having any N&V. She has heartburn sometimes, particularly after eating spicy foods. She does enjoy spicy foods. Patient is fairly active. She works at [employer] and can sometimes lift packages up to 80 lbs. She doesn't restrict her diet in any way. Last BM was a week ago. Miralax did not work for helping her constipation.   Wt Readings from Last 3 Encounters:  10/13/23 103 lb 6.4 oz (46.9 kg)  09/04/23 103 lb 9.9 oz (47 kg)  08/30/23 104 lb 0.9 oz (47.2 kg)   Past Medical History:  Diagnosis Date   Acute medial meniscus tear    ADHD (attention deficit hyperactivity disorder) 11/09/2012   Allergic rhinitis 11/09/2012   Allergy    Alopecia    Anxiety    Mom with severe anxiety, became addicted to pain meds   Asthma, mild intermittent 03/10/2016   Cardiac murmur 03/25/2013   Chronic kidney disease    Constipation    Fatigue    Frequent headaches    Poor eating habits    Scoliosis of thoracic spine 04/19/2014   UTI (urinary tract infection) 01/29/2022     Past Surgical History:  Procedure Laterality Date   BIOPSY  09/05/2023   Procedure: BIOPSY;  Surgeon: Imogene Burn, MD;  Location: [facility] ENDOSCOPY;  Service: Gastroenterology;;    ESOPHAGOGASTRODUODENOSCOPY (EGD) WITH PROPOFOL N/A 09/05/2023   Procedure: ESOPHAGOGASTRODUODENOSCOPY (EGD) WITH PROPOFOL;  Surgeon: Imogene Burn, MD;  Location: [facility] ENDOSCOPY;  Service: Gastroenterology;  Laterality: N/A;   NO PAST SURGERIES     Family History  Problem Relation Age of Onset   Miscarriages / Stillbirths Mother    Cancer Mother    Drug abuse Mother    Stroke Father    Asthma Father    Drug abuse Father    Asthma Sister    Behavior problems Sister    Asthma Maternal Aunt    Heart disease Maternal Uncle    HIV Paternal Uncle    Diabetes Maternal Grandfather    Hypertension Maternal Grandfather    Alcohol abuse Other    Birth defects Neg Hx    Social History   Tobacco Use   Smoking status: Former    Types: E-cigarettes   Smokeless tobacco: Never   Tobacco comments:    Mom stated this was a short time freshman year.  Vaping Use   Vaping status: Former   Quit date: 06/14/2021   Substances: Nicotine, Flavoring  Substance Use Topics   Alcohol use: No   Drug use: No   Current Outpatient Medications  Medication Sig Dispense Refill   acetaminophen (TYLENOL) 500 MG tablet Take 2 tablets (1,000 mg total) by  mouth 2 (two) times daily as needed for fever or moderate pain (pain score 4-6).     [Paused] AUROVELA 24 FE 1-20 MG-MCG(24) tablet Take 1 tablet by mouth daily. (Patient not taking: Reported on 07/24/2023)     ondansetron (ZOFRAN-ODT) 4 MG disintegrating tablet Take 1 tablet (4 mg total) by mouth every 8 (eight) hours as needed. (Patient not taking: Reported on 08/31/2023) 10 tablet 0   pantoprazole (PROTONIX) 40 MG tablet Take 1 tablet (40 mg total) by mouth daily. 30 tablet 0   traMADol (ULTRAM) 50 MG tablet Take 1 tablet (50 mg total) by mouth every 6 (six) hours as needed for moderate pain (pain score 4-6). 15 tablet 0   No current facility-administered medications for this visit.   Allergies  Allergen Reactions   Percocet [Oxycodone-Acetaminophen] Nausea And  Vomiting    Nightmares    Penicillins Rash     Review of Systems: All systems reviewed and negative except where noted in HPI.   Physical Exam: Ht 5' 9.02" (1.753 m)   Wt 103 lb 6.4 oz (46.9 kg)   BMI 15.26 kg/m  Constitutional: Pleasant,well-developed, female in no acute distress. HEENT: Normocephalic and atraumatic. Conjunctivae are normal. No scleral icterus. Cardiovascular: Normal rate, regular rhythm.  Pulmonary/chest: Effort normal and breath sounds normal. No wheezing, rales or rhonchi. Abdominal: Soft, nondistended, tender to LLQ. Bowel sounds active throughout. There are no masses palpable. No hepatomegaly. Extremities: No edema Neurological: Alert and oriented to person place and time. Skin: Skin is warm and dry. No rashes noted. Psychiatric: Normal mood and affect. Behavior is normal.  Labs 08/2023: CBC low at 8.6 and low platelets of 110. CMP with low albumin of 2.6 and elevated AST of 62. ASMA positive at 27. IgG nml. ANA negative. TTG IgA negative. IgA nml. Folate nml. Vit B12 nml. Iron sat low at 6%. Acute hepatitis panel negative. Lipase nml.   CT A/P w/contrast 08/30/23: IMPRESSION: Pericholecystic fluid/wall thickening suspicious for acute cholecystitis. Ultrasound may be helpful for further evaluation. These changes are new from the most recent exam. Mild periportal edema which may be related to inflammatory change. Free fluid within the pelvis likely physiologic in nature.  RUQ U/S 09/03/23: IMPRESSION: 1. Limited evaluation of the gallbladder given decompressed state and lack of NPO status. There is persistent gallbladder wall thickening and trace pericholecystic fluid, similar to prior CT exam. These findings, coupled with positive sonographic Murphy sign, are consistent with acute cholecystitis. If further imaging is desired, nuclear medicine hepatobiliary scan could be considered.  HIDA scan 09/04/23: IMPRESSION: Normal hepatobiliary scan,  demonstrating patency of both the cystic and common bile ducts. Increased gallbladder ejection fraction of 99%, which may be seen with biliary hyperkinesia.  EGD 09/05/23: - Normal esophagus. Biopsied. - Gastritis. Biopsied. - Normal examined duodenum. Biopsied. Path: A. STOMACH, BIOPSY:  Reactive gastropathy with minimal chronic gastritis  Negative for H. pylori, intestinal metaplasia, dysplasia and carcinoma  B. DUODENUM, BIOPSY:  Benign duodenal mucosa with no diagnostic abnormality  C. DISTAL ESOPHAGUS, BIOPSY:  Reactive squamous mucosa  Negative for glandular epithelium, eosinophils, dysplasia and carcinoma  D. PROXIMAL ESOPHAGUS, BIOPSY:  Reactive squamous mucosa  Negative for glandular epithelium, eosinophils, dysplasia and carcinoma   ASSESSMENT AND PLAN: IDA Elevated LFTs Elevated ASMA Hyperkinetic gallbladder RUQ abdominal pain GERD Constipation Thrombocytopenia - Check LFTs, ASMA, IgG, CBC, ferritin/IBC, AMA - Restart Protonix 40 mg every day - Referral to hematology for consideration IV iron infusion - Drink 8 cups of water per day,  walk 30 min per day, daily fiber supplement - Start Linzess 145 mcg every day. Will give some samples - Patient will follow up with OB/GYN regarding menstrual periods - RTC 3 months  Eulah Pont, MD

## 2023-10-16 ENCOUNTER — Encounter: Payer: Self-pay | Admitting: Internal Medicine

## 2023-10-16 LAB — IGG: IgG (Immunoglobin G), Serum: 1547 mg/dL (ref 600–1640)

## 2023-10-16 LAB — ANTI-SMOOTH MUSCLE ANTIBODY, IGG: Actin (Smooth Muscle) Antibody (IGG): <20 U (ref <20)

## 2023-10-16 LAB — MITOCHONDRIAL ANTIBODIES: Mitochondrial M2 Ab, IgG: <=20 U (ref <=20.0)

## 2023-10-20 ENCOUNTER — Encounter: Payer: Self-pay | Admitting: Hematology & Oncology

## 2023-12-15 ENCOUNTER — Ambulatory Visit: Admitting: Adult Health

## 2023-12-15 ENCOUNTER — Encounter: Payer: Self-pay | Admitting: Adult Health

## 2023-12-15 VITALS — BP 94/60 | HR 87 | Ht 69.0 in | Wt 104.0 lb

## 2023-12-15 DIAGNOSIS — Z3202 Encounter for pregnancy test, result negative: Secondary | ICD-10-CM | POA: Diagnosis not present

## 2023-12-15 DIAGNOSIS — N946 Dysmenorrhea, unspecified: Secondary | ICD-10-CM | POA: Diagnosis not present

## 2023-12-15 DIAGNOSIS — Z30016 Encounter for initial prescription of transdermal patch hormonal contraceptive device: Secondary | ICD-10-CM | POA: Insufficient documentation

## 2023-12-15 DIAGNOSIS — N926 Irregular menstruation, unspecified: Secondary | ICD-10-CM | POA: Diagnosis not present

## 2023-12-15 LAB — POCT URINE PREGNANCY: Preg Test, Ur: NEGATIVE

## 2023-12-15 MED ORDER — NORELGESTROMIN-ETH ESTRADIOL 150-35 MCG/24HR TD PTWK: 1.0000 | MEDICATED_PATCH | TRANSDERMAL | 12 refills | Status: AC

## 2023-12-15 NOTE — Progress Notes (Addendum)
  Subjective:     Patient ID: Melissa Farley, female   DOB: October 01, 2000, 23 y.o.   MRN: [id number]  HPI Melissa Farley is a 23 year old white female, with SO, G2P1011, in to discuss getting on the patch,to help with periods. Periods have been irregular and has bad cramping.   Review of Systems Periods are irregular, can have 2-3 a month Has bad cramps No pain with sex Reviewed past medical,surgical, social and family history. Reviewed medications and allergies.     Objective:   Physical Exam BP 94/60 (BP Location: Right Arm, Patient Position: Sitting, Cuff Size: Normal)   Pulse 87   Ht 5\' 9"  (1.753 m)   Wt 104 lb (47.2 kg)   LMP 12/02/2023 (Exact Date)   Breastfeeding No   BMI 15.36 kg/m     UPT is negative   Skin warm and dry. Lungs: clear to ausculation bilaterally. Cardiovascular: regular rate and rhythm.   Upstream - 12/15/23 1418       Pregnancy Intention Screening   Does the patient want to become pregnant in the next year? No    Does the patient's partner want to become pregnant in the next year? No    Would the patient like to discuss contraceptive options today? No      Contraception Wrap Up   Current Method No Method - Other Reason    End Method Contraceptive Patch    Contraception Counseling Provided Yes    How was the end contraceptive method provided? Prescription             Assessment:     1. Pregnancy examination or test, negative result  - POCT urine pregnancy  2. Irregular periods (Primary) Periods irregular,  3. Dysmenorrhea Has bad cramps  4. Encounter for initial prescription of transdermal patch hormonal contraceptive device Denies MI,stroke, DVT, breast cancer or migraines with aura Will rx xulane Can start today Use condoms for 1 month Meds ordered this encounter  Medications   norelgestromin-ethinyl estradiol (XULANE) 150-35 MCG/24HR transdermal patch    Sig: Place 1 patch onto the skin once a week.    Dispense:  3 patch    Refill:   12    Supervising Provider:   Wendelyn Halter [[id number]]      Plan:     Follow up in 3 months for pap and physical and ROS

## 2024-01-14 ENCOUNTER — Ambulatory Visit: Admitting: Internal Medicine

## 2024-03-01 ENCOUNTER — Telehealth: Payer: Self-pay | Admitting: *Deleted

## 2024-03-01 NOTE — Progress Notes (Signed)
 Complex Care Management Note Care Guide Note  03/01/2024 Name: Melissa Farley MRN: [id number] DOB: [birth year]   Complex Care Management Outreach Attempts: An unsuccessful telephone outreach was attempted today to offer the patient information about available complex care management services.  Follow Up Plan:  Additional outreach attempts will be made to offer the patient complex care management information and services.   Encounter Outcome:  No Answer  Thedford Franks, CMA [place]  [facility], [facility] Guide Direct Dial: [phone]  Fax: [phone] Website: [email or web address]

## 2024-03-02 NOTE — Progress Notes (Signed)
 Complex Care Management Note Care Guide Note  03/02/2024 Name: Melissa Farley MRN: [id number] DOB: 08-06-2000   Complex Care Management Outreach Attempts: A second unsuccessful outreach was attempted today to offer the patient with information about available complex care management services.  Follow Up Plan:  Additional outreach attempts will be made to offer the patient complex care management information and services.   Encounter Outcome:  No Answer  Thedford Franks, CMA [place]  [facility], [facility] Guide Direct Dial: [phone]  Fax: [phone] Website: [email or web address]

## 2024-03-09 NOTE — Progress Notes (Signed)
 Pt declined having pcp - transferred to E2C2 to establish

## 2024-03-16 ENCOUNTER — Ambulatory Visit: Admitting: Adult Health

## 2024-03-16 ENCOUNTER — Encounter: Payer: Self-pay | Admitting: Adult Health

## 2024-03-16 ENCOUNTER — Other Ambulatory Visit (HOSPITAL_COMMUNITY)
Admission: RE | Admit: 2024-03-16 | Discharge: 2024-03-16 | Disposition: A | Discharge status: Home / Self Care | Source: Ambulatory Visit | Attending: Adult Health | Admitting: Adult Health

## 2024-03-16 VITALS — BP 96/65 | HR 86 | Ht 69.0 in | Wt 105.0 lb

## 2024-03-16 DIAGNOSIS — Z3045 Encounter for surveillance of transdermal patch hormonal contraceptive device: Secondary | ICD-10-CM | POA: Insufficient documentation

## 2024-03-16 DIAGNOSIS — Z0000 Encounter for general adult medical examination without abnormal findings: Secondary | ICD-10-CM | POA: Insufficient documentation

## 2024-03-16 DIAGNOSIS — N6312 Unspecified lump in the right breast, upper inner quadrant: Secondary | ICD-10-CM | POA: Diagnosis not present

## 2024-03-16 DIAGNOSIS — Z01419 Encounter for gynecological examination (general) (routine) without abnormal findings: Secondary | ICD-10-CM | POA: Diagnosis not present

## 2024-03-16 DIAGNOSIS — Z3202 Encounter for pregnancy test, result negative: Secondary | ICD-10-CM

## 2024-03-16 DIAGNOSIS — Z803 Family history of malignant neoplasm of breast: Secondary | ICD-10-CM | POA: Diagnosis not present

## 2024-03-16 LAB — POCT URINE PREGNANCY: Preg Test, Ur: NEGATIVE

## 2024-03-16 NOTE — Progress Notes (Signed)
 Patient ID: Melissa Farley, female   DOB: 02/18/2001, 23 y.o.   MRN: [id number] History of Present Illness: Melissa Farley is a 23 year old white female, single, G2P1011, in for a well woman gyn exam and pap. She has noticed tender area right breast. Her mom had breast cancer at 45. Her period is 3 days late on the patch.    Current Medications, Allergies, Past Medical History, Past Surgical History, Family History and Social History were reviewed in [employer] record.     Review of Systems: Patient denies any headaches, hearing loss, fatigue, blurred vision, shortness of breath, chest pain, abdominal pain, problems with bowel movements, urination, or intercourse. No joint pain or mood swings.  See HPI for positives    Physical Exam:BP 96/65 (BP Location: Left Arm, Patient Position: Sitting, Cuff Size: Normal)   Pulse 86   Ht 5' 9 (1.753 m)   Wt 105 lb (47.6 kg)   LMP 02/17/2024   Breastfeeding No   BMI 15.51 kg/m  UPT is negative  General:  Well developed, well nourished, no acute distress Skin:  Warm and dry Neck:  Midline trachea, normal thyroid , good ROM, no lymphadenopathy Lungs; Clear to auscultation bilaterally Breast:  No dominant palpable mass, retraction, or nipple discharge on the left, on the right has mass at 12-1 o'clock about 1 cm and tender, no retraction or nipple discharge  Cardiovascular: Regular rate and rhythm Abdomen:  Soft, non tender, no hepatosplenomegaly Pelvic:  External genitalia is normal in appearance, no lesions.  The vagina is normal in appearance. Urethra has no lesions or masses. The cervix is smooth, pap with GC/CHL performed.  Uterus is felt to be normal size, shape, and contour.  No adnexal masses or tenderness noted.Bladder is non tender, no masses felt. Extremities/musculoskeletal:  No swelling or varicosities noted, no clubbing or cyanosis Psych:  No mood changes, alert and cooperative,seems happy AA is 3 Fall risk is  low    03/16/2024    1:57 PM 05/23/2022    2:05 PM 03/14/2022    8:57 AM  Depression screen PHQ 2/9  Decreased Interest 0 0 0  Down, Depressed, Hopeless 0 0 0  PHQ - 2 Score 0 0 0  Altered sleeping 1 1 2   Tired, decreased energy 1 2 1   Change in appetite 2 0 0  Feeling bad or failure about yourself  0 0 0  Trouble concentrating 1 0 0  Moving slowly or fidgety/restless 0 0 0  Suicidal thoughts 0 0 0  PHQ-9 Score 5 3 3     Declines meds, not depressed     03/16/2024    1:57 PM 05/23/2022    2:05 PM 03/14/2022    8:58 AM 11/15/2021   11:36 AM  GAD 7 : Generalized Anxiety Score  Nervous, Anxious, on Edge 0 1 0 0  Control/stop worrying 0 0 0 0  Worry too much - different things 0 1 1 0  Trouble relaxing 2 0 0 1  Restless 1 0 0 0  Easily annoyed or irritable 3 2 2  0  Afraid - awful might happen 0 0 0 0  Total GAD 7 Score 6 4 3 1       Upstream - 03/16/24 1354       Pregnancy Intention Screening   Does the patient want to become pregnant in the next year? No    Does the patient's partner want to become pregnant in the next year? No  Would the patient like to discuss contraceptive options today? No      Contraception Wrap Up   Current Method Contraceptive Patch    End Method Contraceptive Patch    Contraception Counseling Provided Yes         Examination chaperoned by Clarita Salt LPN  Impression and plan: 1. Routine general medical examination at a health care facility (Primary) Pap sent Pap in 3 years if negative  Physical in 1 year  - Cytology - PAP( [place])  2. Negative pregnancy test - POCT urine pregnancy  3. Encounter for surveillance of transdermal patch hormonal contraceptive device Happy with Xulane has refills    4. Mass of upper inner quadrant of right breast  +mass at 12-1 o'clock about 1 cm and tender, no retraction or nipple discharge  Right breat US  scheduled for her at [facility] 03/23/24 at 3:50 pm  - US  LIMITED ULTRASOUND INCLUDING AXILLA  RIGHT BREAST; Future  5. Family history of breast cancer in mother

## 2024-03-19 ENCOUNTER — Ambulatory Visit: Payer: Self-pay | Admitting: Adult Health

## 2024-03-19 LAB — CYTOLOGY - PAP
Chlamydia: NEGATIVE
Comment: NEGATIVE
Comment: NORMAL
Diagnosis: NEGATIVE
Neisseria Gonorrhea: NEGATIVE

## 2024-03-23 ENCOUNTER — Ambulatory Visit (HOSPITAL_COMMUNITY)
Admission: RE | Admit: 2024-03-23 | Discharge: 2024-03-23 | Disposition: A | Discharge status: Home / Self Care | Source: Ambulatory Visit | Attending: Adult Health | Admitting: Adult Health

## 2024-03-23 DIAGNOSIS — N6312 Unspecified lump in the right breast, upper inner quadrant: Secondary | ICD-10-CM | POA: Diagnosis not present

## 2024-03-23 DIAGNOSIS — C50911 Malignant neoplasm of unspecified site of right female breast: Secondary | ICD-10-CM | POA: Diagnosis not present

## 2024-03-23 DIAGNOSIS — R928 Other abnormal and inconclusive findings on diagnostic imaging of breast: Secondary | ICD-10-CM | POA: Diagnosis not present

## 2024-03-23 DIAGNOSIS — N644 Mastodynia: Secondary | ICD-10-CM | POA: Diagnosis not present

## 2024-03-24 ENCOUNTER — Ambulatory Visit (INDEPENDENT_AMBULATORY_CARE_PROVIDER_SITE_OTHER): Admitting: Family Medicine

## 2024-03-24 ENCOUNTER — Other Ambulatory Visit (HOSPITAL_COMMUNITY): Payer: Self-pay

## 2024-03-24 ENCOUNTER — Encounter: Payer: Self-pay | Admitting: Family Medicine

## 2024-03-24 VITALS — BP 112/66 | HR 63 | Resp 16 | Ht 69.0 in | Wt 106.0 lb

## 2024-03-24 DIAGNOSIS — Z789 Other specified health status: Secondary | ICD-10-CM | POA: Diagnosis not present

## 2024-03-24 DIAGNOSIS — Z91148 Patient's other noncompliance with medication regimen for other reason: Secondary | ICD-10-CM | POA: Diagnosis not present

## 2024-03-24 DIAGNOSIS — M419 Scoliosis, unspecified: Secondary | ICD-10-CM | POA: Diagnosis not present

## 2024-03-24 DIAGNOSIS — Z3045 Encounter for surveillance of transdermal patch hormonal contraceptive device: Secondary | ICD-10-CM | POA: Diagnosis not present

## 2024-03-24 DIAGNOSIS — N946 Dysmenorrhea, unspecified: Secondary | ICD-10-CM

## 2024-03-24 DIAGNOSIS — Z0000 Encounter for general adult medical examination without abnormal findings: Secondary | ICD-10-CM | POA: Diagnosis not present

## 2024-03-24 DIAGNOSIS — F4310 Post-traumatic stress disorder, unspecified: Secondary | ICD-10-CM | POA: Diagnosis not present

## 2024-03-24 DIAGNOSIS — R011 Cardiac murmur, unspecified: Secondary | ICD-10-CM | POA: Diagnosis not present

## 2024-03-24 DIAGNOSIS — K219 Gastro-esophageal reflux disease without esophagitis: Secondary | ICD-10-CM | POA: Diagnosis not present

## 2024-03-24 DIAGNOSIS — Q970 Karyotype 47, XXX: Secondary | ICD-10-CM | POA: Diagnosis not present

## 2024-03-24 DIAGNOSIS — N926 Irregular menstruation, unspecified: Secondary | ICD-10-CM | POA: Diagnosis not present

## 2024-03-24 LAB — HCG, TOTAL, QUANTITATIVE: hCG, Beta Chain, Quant, S: <5 m[IU]/mL

## 2024-03-24 MED ORDER — NORELGESTROMIN-ETH ESTRADIOL 150-35 MCG/24HR TD PTWK
1.0000 | MEDICATED_PATCH | TRANSDERMAL | 3 refills | Status: AC
  Filled 2024-03-24: qty 9, 63d supply, fill #0

## 2024-03-24 NOTE — Progress Notes (Signed)
 Name: Melissa Farley   MRN: [id number]    DOB: July 14, 2001   Date:03/24/2024       Progress Note  Chief Complaint  Patient presents with   Establish Care   Late Period    Has OB/GYN- last home test neg   Migraine     Subjective:   Melissa Farley is a 23 y.o. female, presents to clinic to est care  Concern with birth control patch and irregular menses with some breaks on her birth control patch due to supply and then she took off patch 1 week early.  12-13 days late with irregular use of patch due to supply issues  Discussed the use of AI scribe software for clinical note transcription with the patient, who gave verbal consent to proceed.  History of Present Illness Melissa Farley is a 23 year old female who presents with concerns about irregular menstrual cycles.  Menstrual irregularity - Irregular menstrual cycles with current episode of menses delayed by ten days after discontinuing birth control patch prior to expected period - Three negative home pregnancy tests - History of delayed HCG detection in prior pregnancy, not detectable until seven weeks gestation - Single episode of spotting lasting approximately four hours during a work shift, with no further bleeding - No current plans for pregnancy  Contraceptive use and adherence - Currently using birth control patch, but experienced interruptions in use due to pharmacy supply and insurance coverage issues - Has been without patches for up to two weeks at a time due to stock shortages at [employer] [facility] ([facility] in [place] - History of using multiple contraceptive methods including oral contraceptive pills, Depo-Provera  injection, and Nexplanon  implant, each associated with different side effects - Irregular use of contraception attributed to refill and availability issues, believed to contribute to menstrual cycle disruption  She did not mention HA/migraines or sx or concerns only that she was  called and told to est with PCP due to insurance needs  Would like to do CPE in the future   Hx of anemia Hemoglobin  Date Value Ref Range Status  10/13/2023 11.5 (L) 12.0 - 15.0 g/dL Final  97/77/7974 8.6 (L) 12.0 - 15.0 g/dL Final  97/79/7974 8.7 (L) 12.0 - 15.0 g/dL Final  97/80/7974 8.6 (L) 12.0 - 15.0 g/dL Final  98/90/7974 89.5 (L) 11.1 - 15.9 g/dL Final  87/72/7975 89.7 (L) 11.1 - 15.9 g/dL Final  91/68/7976 89.1 (L) 11.1 - 15.9 g/dL Final  94/95/7976 87.1 11.1 - 15.9 g/dL Final   Allergies/asthma   ADHD with prior cardiac eval stating pt was able to take stimulants after completed cardiac eval    Current Outpatient Medications:    norelgestromin -ethinyl estradiol  (XULANE) 150-35 MCG/24HR transdermal patch, Place 1 patch onto the skin once a week., Disp: 9 patch, Rfl: 3   norelgestromin -ethinyl estradiol  (XULANE) 150-35 MCG/24HR transdermal patch, Place 1 patch onto the skin once a week. (Patient not taking: Reported on 03/24/2024), Disp: 3 patch, Rfl: 12  Patient Active Problem List   Diagnosis Date Noted   Family history of breast cancer in mother 03/16/2024   Mass of upper inner quadrant of right breast 03/16/2024   Encounter for surveillance of transdermal patch hormonal contraceptive device 03/16/2024   Dysmenorrhea 12/15/2023   Irregular periods 12/15/2023   Anemia 09/05/2023   Elevated LFTs 09/05/2023   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   Gastroesophageal reflux disease without esophagitis 06/24/2019   Vasovagal syncope 03/07/2015   Scoliosis of thoracic spine  04/19/2014   PTSD (post-traumatic stress disorder) 08/03/2013   Murmur 03/25/2013   ADHD (attention deficit hyperactivity disorder) 11/09/2012   Allergic rhinitis 11/09/2012    Past Surgical History:  Procedure Laterality Date   BIOPSY  09/05/2023   Procedure: BIOPSY;  Surgeon: Federico Rosario BROCKS, MD;  Location: [facility] ENDOSCOPY;  Service: Gastroenterology;;   ESOPHAGOGASTRODUODENOSCOPY (EGD) WITH PROPOFOL  N/A  09/05/2023   Procedure: ESOPHAGOGASTRODUODENOSCOPY (EGD) WITH PROPOFOL ;  Surgeon: Federico Rosario BROCKS, MD;  Location: [facility] ENDOSCOPY;  Service: Gastroenterology;  Laterality: N/A;   NO PAST SURGERIES      Family History  Problem Relation Age of Onset   Diabetes Maternal Grandfather    Hypertension Maternal Grandfather    Stroke Father    Asthma Father    Drug abuse Father    Breast cancer Mother    Miscarriages / [place] Mother    Cancer Mother    Drug abuse Mother    Asthma Sister    Behavior problems Sister    Asthma Maternal Aunt    Heart disease Maternal Uncle    HIV Paternal Uncle    Alcohol abuse Other    Birth defects Neg Hx     Social History   Tobacco Use   Smoking status: Former    Types: E-cigarettes   Smokeless tobacco: Never   Tobacco comments:    Mom stated this was a short time freshman year.  Vaping Use   Vaping status: Former   Quit date: 06/14/2021   Substances: Nicotine, Flavoring  Substance Use Topics   Alcohol use: Yes   Drug use: No     Allergies  Allergen Reactions   Percocet [Oxycodone -Acetaminophen ] Nausea And Vomiting    Nightmares    Penicillins Rash    Health Maintenance  Topic Date Due   Influenza Vaccine  02/13/2024   COVID-19 Vaccine (1 - 2024-25 season) 04/09/2024 (Originally 03/15/2024)   DTaP/Tdap/Td (6 - Td or Tdap) 03/24/2025 (Originally 10/02/2023)   CHLAMYDIA SCREENING  03/16/2025   Cervical Cancer Screening (Pap smear)  03/17/2027   Pneumococcal Vaccine  Completed   Hepatitis B Vaccines 19-59 Average Risk  Completed   HPV VACCINES  Completed   Hepatitis C Screening  Completed   HIV Screening  Completed   Meningococcal B Vaccine  Completed    Chart Review Today: I personally reviewed active problem list, medication list, allergies, family history, social history, health maintenance, notes from last encounter, lab results, imaging with the patient/caregiver today.   Review of Systems  Constitutional: Negative.   HENT:  Negative.    Eyes: Negative.   Respiratory: Negative.    Cardiovascular: Negative.   Gastrointestinal: Negative.   Endocrine: Negative.   Genitourinary: Negative.   Musculoskeletal: Negative.   Skin: Negative.   Allergic/Immunologic: Negative.   Neurological: Negative.   Hematological: Negative.   Psychiatric/Behavioral: Negative.    All other systems reviewed and are negative.    Objective:   Vitals:   03/24/24 1052  BP: 112/66  Pulse: 63  Resp: 16  SpO2: 97%  Weight: 106 lb (48.1 kg)  Height: 5' 9 (1.753 m)    Body mass index is 15.65 kg/m.  Physical Exam Vitals and nursing note reviewed.  Constitutional:      General: She is not in acute distress.    Appearance: Normal appearance. She is well-developed and underweight. She is not ill-appearing, toxic-appearing or diaphoretic.  HENT:     Head: Normocephalic and atraumatic.     Right Ear: External ear normal.  Left Ear: External ear normal.     Nose: Nose normal.  Eyes:     General: No scleral icterus.       Right eye: No discharge.        Left eye: No discharge.     Conjunctiva/sclera: Conjunctivae normal.  Neck:     Trachea: No tracheal deviation.  Cardiovascular:     Rate and Rhythm: Normal rate. No extrasystoles are present.    Pulses: Normal pulses.          Radial pulses are 2+ on the right side and 2+ on the left side.     Heart sounds: Heart sounds not distant. No murmur heard.    No friction rub. No gallop.  Pulmonary:     Effort: Pulmonary effort is normal. No respiratory distress.     Breath sounds: No stridor.  Musculoskeletal:     Right lower leg: No edema.     Left lower leg: No edema.     Comments: Scoliosis noted to mid back  Skin:    General: Skin is warm and dry.     Findings: No rash.  Neurological:     Mental Status: She is alert.     Motor: No abnormal muscle tone.     Coordination: Coordination normal.     Gait: Gait normal.  Psychiatric:        Mood and Affect: Mood  normal.        Behavior: Behavior normal. Behavior is cooperative.      Functional Status Survey: Is the patient deaf or have difficulty hearing?: No Does the patient have difficulty seeing, even when wearing glasses/contacts?: No Does the patient have difficulty concentrating, remembering, or making decisions?: No Does the patient have difficulty walking or climbing stairs?: No Does the patient have difficulty dressing or bathing?: No Does the patient have difficulty doing errands alone such as visiting a doctor's office or shopping?: No Results for orders placed or performed in visit on 03/16/24  Cytology - PAP( [place])   Collection Time: 03/16/24  1:57 PM  Result Value Ref Range   Neisseria Gonorrhea Negative    Chlamydia Negative    Adequacy      Satisfactory for evaluation; transformation zone component PRESENT.   Diagnosis      - Negative for intraepithelial lesion or malignancy (NILM)   Comment Normal Reference Ranger Chlamydia - Negative    Comment      Normal Reference Range Neisseria Gonorrhea - Negative  POCT urine pregnancy   Collection Time: 03/16/24  2:02 PM  Result Value Ref Range   Preg Test, Ur Negative Negative      Assessment & Plan:   Encounter for medical examination to establish care Here to establish care with a primary care provider as required by insurance. No specific health concerns at this time, primarily seeking to maintain insurance coverage. - Establish care with primary care provider. Later only mentions concern with birth control and menses - no other acute or chronic concerns - remaining problems from chart review/hx care everywhere  Irregular periods -     hCG, Total, Quantitative -     AMB Referral VBCI Care Management  Uses hormonal contraceptive patch as primary birth control method -     hCG, Total, Quantitative -     AMB Referral VBCI Care Management  Missed menses -     hCG, Total, Quantitative -     AMB Referral VBCI  Care Management  Murmur Assessment &  Plan: Prior cardiac eval  Final Diagnosis: 1. Innocent Murmur  Disposition: Activities: No restrictions. Medications: No changes. SBE Prophylaxis: Not indicated. Follow-up: As needed. Cathlyn Rea, M.D. Assistant Professor Pediatric Cardiology    Trisomy X syndrome Assessment & Plan: Reviewed genetic counseling note from 12/13/21 and telephone communication from 12/28/21 Carrier of gene Different phenotypes - pt appears to be very tall  No effect on fertility per chart notes/genetics   Scoliosis of thoracic spine, unspecified scoliosis type Assessment & Plan: Per chart and prior xrays - reviewed imaging from care everywhere while reviewing chart/hx and updating   Gastroesophageal reflux disease without esophagitis Assessment & Plan: Not currently on meds   Dysmenorrhea (see below) Assessment & Plan: Has been managed by OBGYN with patch S/E and intolerance to depo shot, nexplanon  and OCP   PTSD (post-traumatic stress disorder) Assessment & Plan: Appears to be significant amount of mental health in family and traumatic events/substance use/abuse Pt w/o any current complaints, not on meds    03/24/2024   10:51 AM 03/16/2024    1:57 PM 05/23/2022    2:05 PM  Depression screen PHQ 2/9  Decreased Interest 0 0 0  Down, Depressed, Hopeless 0 0 0  PHQ - 2 Score 0 0 0  Altered sleeping 1 1 1   Tired, decreased energy 1 1 2   Change in appetite 0 2 0  Feeling bad or failure about yourself  0 0 0  Trouble concentrating 1 1 0  Moving slowly or fidgety/restless 0 0 0  Suicidal thoughts 0 0 0  PHQ-9 Score 3 5 3       03/24/2024   10:52 AM 03/16/2024    1:57 PM 05/23/2022    2:05 PM 03/14/2022    8:58 AM  GAD 7 : Generalized Anxiety Score  Nervous, Anxious, on Edge 0 0 1 0  Control/stop worrying 0 0 0 0  Worry too much - different things 0 0 1 1  Trouble relaxing 0 2 0 0  Restless 0 1 0 0  Easily annoyed or irritable 3 3 2 2   Afraid -  awful might happen 0 0 0 0  Total GAD 7 Score 3 6 4 3     Phq 9 and GAD 7 reviewed and neg today    Encounter for surveillance of transdermal patch hormonal contraceptive device Assessment & Plan: Agree with OBGYN recommendations to continue with regular use of patch since this worked best for her - will work with pharmacy team to see if better supply and refills can be achieved to avoid breaks in med management which will cause irregular cycles, increased chance of pregnancy  Orders: -     Norelgestromin -Eth Estradiol ; Place 1 patch onto the skin once a week.  Dispense: 9 patch; Refill: 3 -     AMB Referral VBCI Care Management  Variable compliance with medication therapy -     AMB Referral VBCI Care Management -     Norelgestromin -Eth Estradiol ; Place 1 patch onto the skin once a week.  Dispense: 9 patch; Refill: 3   Assessment & Plan Irregular menstruation and contraceptive management Irregular menstruation likely due to inconsistent use of the birth control patch, exacerbated by pharmacy supply issues. Currently 10 days late for menstruation with three negative pregnancy tests. Prefers to continue using the birth control patch despite supply challenges due to adverse effects from other contraceptive methods. - Perform blood HCG test to confirm pregnancy status. - Continue birth control patch as advised by OB/GYN. - Explore  mail order options for birth control patches through [place] pharmacies to ensure consistent supply. - Advise use of condoms if birth control patch is unavailable/missed/falls off etc - Discuss with  pharmacists about insurance coverage and supply options for birth control patches.     Recording duration: 23 minutes   Labs today - multiple prior neg POC Urine preg at home and at GYN office Still irregular with hx of late + preg tests only showing at 7 w EGA, will do blood quantitative - if neg reassured pt very very unlikely to be pregnant  would agree with OBGYN and recommend contiuating of current patch, if elevated trend (repeat lab in 2 days) and f/up with GYN additionally she would be advised to remove birth control patch if HCG elevated   Return for CPE in next 6 months.   Michelene Cower, PA-C 03/24/24 7:00 PM

## 2024-03-24 NOTE — Assessment & Plan Note (Signed)
 Prior cardiac eval  Final Diagnosis: 1. Innocent Murmur  Disposition: Activities: No restrictions. Medications: No changes. SBE Prophylaxis: Not indicated. Follow-up: As needed. Cathlyn Rea, M.D. Assistant Professor Pediatric Cardiology

## 2024-03-24 NOTE — Assessment & Plan Note (Signed)
 Per chart and prior xrays - reviewed imaging from care everywhere while reviewing chart/hx and updating

## 2024-03-24 NOTE — Assessment & Plan Note (Signed)
 Has been managed by OBGYN with patch S/E and intolerance to depo shot, nexplanon  and OCP

## 2024-03-24 NOTE — Assessment & Plan Note (Signed)
Not currently on meds.   °

## 2024-03-24 NOTE — Assessment & Plan Note (Signed)
 Appears to be significant amount of mental health in family and traumatic events/substance use/abuse Pt w/o any current complaints, not on meds

## 2024-03-24 NOTE — Assessment & Plan Note (Signed)
 Agree with OBGYN recommendations to continue with regular use of patch since this worked best for her - will work with pharmacy team to see if better supply and refills can be achieved to avoid breaks in med management which will cause irregular cycles, increased chance of pregnancy

## 2024-03-24 NOTE — Assessment & Plan Note (Addendum)
 Reviewed genetic counseling note from 12/13/21 and telephone communication from 12/28/21 Carrier of gene Different phenotypes - pt appears to be very tall  No effect on fertility per chart notes/genetics

## 2024-03-25 ENCOUNTER — Ambulatory Visit: Payer: Self-pay

## 2024-03-30 ENCOUNTER — Telehealth: Payer: Self-pay

## 2024-03-30 NOTE — Progress Notes (Unsigned)
 Complex Care Management Note Care Guide Note  03/30/2024 Name: Melissa Farley MRN: [id number] DOB: 2000/07/30   Complex Care Management Outreach Attempts: An unsuccessful telephone outreach was attempted today to offer the patient information about available complex care management services.  Follow Up Plan:  Additional outreach attempts will be made to offer the patient complex care management information and services.   Encounter Outcome:  No Answer  Dreama [facility]wood [facility]  [facility], [facility] VBCI Assistant Direct Dial: [phone]  Fax: [phone]

## 2024-04-01 NOTE — Progress Notes (Signed)
 Complex Care Management Note  Care Guide Note 04/01/2024 Name: Melissa Farley MRN: [id number] DOB: Jul 19, 2000  [name] is a 23 y.o. year old female who sees Tapia, Leisa, PA-C for primary care. I reached out to [name] by phone today to offer complex care management services.  Ms. Pounds was given information about Complex Care Management services today including:   The Complex Care Management services include support from the care team which includes your Nurse Care Manager, Clinical Social Worker, or Pharmacist.  The Complex Care Management team is here to help remove barriers to the health concerns and goals most important to you. Complex Care Management services are voluntary, and the patient may decline or stop services at any time by request to their care team member.   Complex Care Management Consent Status: Patient agreed to services and verbal consent obtained.   Follow up plan:  Face to Face appointment with complex care management team member scheduled for:  04/06/24 at 2:30 p.m.   Encounter Outcome:  Patient Scheduled  Dreama [facility]wood [facility]  [facility], [facility] VBCI Assistant Direct Dial: [phone]  Fax: [phone]

## 2024-04-02 ENCOUNTER — Telehealth: Payer: Self-pay

## 2024-04-02 ENCOUNTER — Encounter: Payer: Self-pay | Admitting: *Deleted

## 2024-04-02 NOTE — Telephone Encounter (Signed)
 LVM per DPR.  Informed patient there may many reasons for amenorrhea and recommend appt to provider to discuss.  Erminio DELENA Rumps [employer]

## 2024-04-02 NOTE — Telephone Encounter (Signed)
-----   Message from Isaiah ORN sent at 04/02/2024  8:17 AM EDT ----- Regarding: Call Patient Patient called requesting an ultrasound appointment. Informed her I cannot assist without an order. Patient said she is 20 days late on her cycle but has had no positive UPT's at home. Patient requests a call back.  Best contact number is [phone].

## 2024-04-06 ENCOUNTER — Ambulatory Visit

## 2024-04-06 NOTE — Progress Notes (Deleted)
   04/06/2024 Name: Melissa Farley MRN: [id number] DOB: 2001-04-23  No chief complaint on file.   Melissa Farley is a 23 y.o. year old female who was referred for medication management by their primary care provider, Tapia, Leisa, PA-C. They presented for a face to face visit today.   They were referred to the pharmacist by their PCP for assistance with birth control medication access.    Subjective:  Care Team: Primary Care Provider: Leavy Mole, PA-C   At last visit with PCP on 03/24/24, patient reported irregular menses that appeared 10 days after discontinuation of birth control patch. hCG was negative at that time. Of note, she had delayed hCG detection in previous pregnancy after [redacted] weeks gestation. Reported issues with pharmacy supply and insurance issues up to 2 weeks at a time.   Medication Access/Adherence  Current Pharmacy:  [id number] - [place], [place] - [address] AT [facility] OF [address] [place] [address] Phone: [phone] Fax: [phone]  [place] [facility] [address] [place] [address] Phone: [phone] Fax: [phone]   Patient reports affordability concerns with their medications: {YES/NO:21197} Patient reports access/transportation concerns to their pharmacy: {YES/NO:21197} Patient reports adherence concerns with their medications:  {YES/NO:21197} ***   Medication Management:  Current adherence strategy: ***  Patient reports {Good Fair Poor:[id number]} adherence to medications  Patient reports the following barriers to adherence: ***  Recent fill dates:    Objective:  No results found for: HGBA1C  Lab Results  Component Value Date   CREATININE 0.62 09/06/2023   BUN 5 (L) 09/06/2023   NA 138 09/06/2023   K 4.8 09/06/2023   CL 108 09/06/2023   CO2 23 09/06/2023    No results found for: CHOL, HDL, LDLCALC, LDLDIRECT,  TRIG, CHOLHDL  Medications Reviewed Today   Medications were not reviewed in this encounter       Assessment/Plan:   Medication Management: - Currently strategy {sufficient/insufficient:26424} to maintain appropriate adherence to prescribed medication regimen - Suggested use of weekly pill box to organize medications - Created list of medication, indication, and administration time. Provided to patient - Discussed collaboration with local pharmacies for adherence packaging. Reviewed local pharmacies with adherence packaging options. Patient elects to ***    Follow Up Plan: ***  ***

## 2024-04-07 ENCOUNTER — Telehealth: Payer: Self-pay

## 2024-04-07 NOTE — Progress Notes (Signed)
 Complex Care Management Care Guide Note  04/07/2024 Name: Melissa Farley MRN: [id number] DOB: 10-11-2000  Melissa Farley is a 23 y.o. year old female who is a primary care patient of Tapia, Leisa, PA-C and is actively engaged with the care management team. I reached out to Melissa Farley by phone today to assist with re-scheduling  with the RN Case Manager.  Follow up plan: Face to Face appointment with complex care management team member scheduled for:  04/12/24 at 9:00 a.m.   Dreama [facility]wood [facility]  [facility], [facility] VBCI Assistant Direct Dial: [phone]  Fax: [phone]

## 2024-04-09 NOTE — Progress Notes (Deleted)
   04/09/2024 Name: KATIRA DUMAIS MRN: [id number] DOB: May 11, 2001  No chief complaint on file.   NESTA SCATURRO is a 23 y.o. year old female who was referred for medication management by their primary care provider, Tapia, Leisa, PA-C. They presented for a face to face visit today.   They were referred to the pharmacist by their PCP for assistance with birth control medication access.    Subjective:  Care Team: Primary Care Provider: Leavy Mole, PA-C   At last visit with PCP on 03/24/24, patient reported irregular menses that appeared 10 days after discontinuation of birth control patch. hCG was negative at that time. Of note, she had delayed hCG detection in previous pregnancy after [redacted] weeks gestation. Reported issues with pharmacy supply and insurance issues up to 2 weeks at a time.   Medication Access/Adherence  Current Pharmacy:  [id number] - [place], [place] - [address] AT [facility] OF [address] [place] [address] Phone: [phone] Fax: [phone]  [place] [facility] [address] [place] [address] Phone: [phone] Fax: [phone]   Patient reports affordability concerns with their medications: {YES/NO:21197} Patient reports access/transportation concerns to their pharmacy: {YES/NO:21197} Patient reports adherence concerns with their medications:  {YES/NO:21197} ***   Medication Management:  Current adherence strategy: ***  Patient reports {Good Fair [phone]} adherence to medications  Patient reports the following barriers to adherence: ***  Recent fill dates:    Objective:  No results found for: HGBA1C  Lab Results  Component Value Date   CREATININE 0.62 09/06/2023   BUN 5 (L) 09/06/2023   NA 138 09/06/2023   K 4.8 09/06/2023   CL 108 09/06/2023   CO2 23 09/06/2023    No results found for: CHOL, HDL, LDLCALC, LDLDIRECT,  TRIG, CHOLHDL  Medications Reviewed Today   Medications were not reviewed in this encounter       Assessment/Plan:   Medication Management: - Currently strategy {sufficient/insufficient:26424} to maintain appropriate adherence to prescribed medication regimen - Suggested use of weekly pill box to organize medications - Created list of medication, indication, and administration time. Provided to patient - Discussed collaboration with local pharmacies for adherence packaging. Reviewed local pharmacies with adherence packaging options. Patient elects to ***    Follow Up Plan: ***  ***

## 2024-04-12 ENCOUNTER — Telehealth: Payer: Self-pay

## 2024-04-12 ENCOUNTER — Ambulatory Visit

## 2024-04-12 NOTE — Progress Notes (Unsigned)
 Complex Care Management Note Care Guide Note  04/12/2024 Name: Melissa Farley MRN: [id number] DOB: [birth year]   Complex Care Management Outreach Attempts: An unsuccessful telephone outreach was attempted today to offer the patient information about available complex care management services.  Follow Up Plan:  Additional outreach attempts will be made to offer the patient complex care management information and services.   Encounter Outcome:  No Answer  Dreama [facility]  [facility], [facility] VBCI Assistant Direct Dial: [phone]  Fax: [phone]

## 2024-04-14 NOTE — Progress Notes (Signed)
 Complex Care Management Note Care Guide Note  04/14/2024 Name: Melissa Farley MRN: [id number] DOB: 04-Aug-2000   Complex Care Management Outreach Attempts: A second unsuccessful outreach was attempted today to offer the patient with information about available complex care management services.  Follow Up Plan:  No further outreach attempts will be made at this time. We have been unable to contact the patient to offer or enroll patient in complex care management services.  Encounter Outcome:  No Answer  Dreama [facility]  [facility], [facility] VBCI Assistant Direct Dial: [phone]  Fax: [phone]

## 2024-04-28 ENCOUNTER — Ambulatory Visit (HOSPITAL_BASED_OUTPATIENT_CLINIC_OR_DEPARTMENT_OTHER): Admitting: Orthopaedic Surgery

## 2024-05-17 ENCOUNTER — Encounter: Payer: Self-pay | Admitting: Radiology

## 2024-05-18 DIAGNOSIS — R059 Cough, unspecified: Secondary | ICD-10-CM | POA: Diagnosis not present

## 2024-05-18 DIAGNOSIS — Z20822 Contact with and (suspected) exposure to covid-19: Secondary | ICD-10-CM | POA: Diagnosis not present

## 2024-05-18 DIAGNOSIS — J029 Acute pharyngitis, unspecified: Secondary | ICD-10-CM | POA: Diagnosis not present

## 2024-06-02 ENCOUNTER — Telehealth: Payer: Self-pay

## 2024-06-02 NOTE — Telephone Encounter (Signed)
 Patient called in stating she hasn't had a period in two months. She has taken multiple home pregnancy tests and some are positive and negative.   Spoke with Dr. Barbra and he advised for patient to have an hcg drawn. She was scheduled for tomorrow afternoon.

## 2024-06-03 ENCOUNTER — Other Ambulatory Visit

## 2024-06-03 DIAGNOSIS — N912 Amenorrhea, unspecified: Secondary | ICD-10-CM

## 2024-06-04 ENCOUNTER — Ambulatory Visit: Payer: Self-pay | Admitting: Family Medicine

## 2024-06-04 LAB — BETA HCG QUANT (REF LAB): hCG Quant: <1 m[IU]/mL

## 2024-06-25 DIAGNOSIS — K029 Dental caries, unspecified: Secondary | ICD-10-CM | POA: Diagnosis not present
